# Patient Record
Sex: Male | Born: 1970 | Race: Asian | Hispanic: No | Marital: Married | Smoking: Never smoker
Health system: Southern US, Community
[De-identification: ages and names within clinical notes are randomized; demographics above are authoritative.]

## PROBLEM LIST (undated history)

## (undated) DIAGNOSIS — E119 Type 2 diabetes mellitus without complications: Secondary | ICD-10-CM

## (undated) DIAGNOSIS — R519 Headache, unspecified: Secondary | ICD-10-CM

## (undated) DIAGNOSIS — R079 Chest pain, unspecified: Secondary | ICD-10-CM

## (undated) DIAGNOSIS — I10 Essential (primary) hypertension: Secondary | ICD-10-CM

## (undated) DIAGNOSIS — R51 Headache: Secondary | ICD-10-CM

## (undated) DIAGNOSIS — K5792 Diverticulitis of intestine, part unspecified, without perforation or abscess without bleeding: Secondary | ICD-10-CM

## (undated) HISTORY — PX: APPENDECTOMY: SHX54

---

## 2006-11-22 ENCOUNTER — Emergency Department (HOSPITAL_COMMUNITY): Admission: EM | Admit: 2006-11-22 | Discharge: 2006-11-22 | Payer: Self-pay | Admitting: Emergency Medicine

## 2011-12-28 ENCOUNTER — Emergency Department (INDEPENDENT_AMBULATORY_CARE_PROVIDER_SITE_OTHER)
Admission: EM | Admit: 2011-12-28 | Discharge: 2011-12-28 | Disposition: A | Discharge status: Home / Self Care | Payer: Self-pay | Source: Home / Self Care | Attending: Emergency Medicine | Admitting: Emergency Medicine

## 2011-12-28 ENCOUNTER — Emergency Department (INDEPENDENT_AMBULATORY_CARE_PROVIDER_SITE_OTHER): Payer: Self-pay

## 2011-12-28 ENCOUNTER — Encounter (HOSPITAL_COMMUNITY): Payer: Self-pay | Admitting: *Deleted

## 2011-12-28 DIAGNOSIS — J40 Bronchitis, not specified as acute or chronic: Secondary | ICD-10-CM

## 2011-12-28 HISTORY — DX: Essential (primary) hypertension: I10

## 2011-12-28 MED ORDER — METHYLPREDNISOLONE 4 MG PO KIT: PACK | ORAL | Status: DC

## 2011-12-28 MED ORDER — IPRATROPIUM BROMIDE 0.02 % IN SOLN
0.5000 mg | Freq: Once | RESPIRATORY_TRACT | Status: AC
  Administered 2011-12-28: 0.5 mg via RESPIRATORY_TRACT

## 2011-12-28 MED ORDER — ALBUTEROL SULFATE HFA 108 (90 BASE) MCG/ACT IN AERS: 1.0000 | INHALATION_SPRAY | Freq: Four times a day (QID) | RESPIRATORY_TRACT | Status: DC | PRN

## 2011-12-28 MED ORDER — ALBUTEROL SULFATE (5 MG/ML) 0.5% IN NEBU
2.5000 mg | INHALATION_SOLUTION | Freq: Once | RESPIRATORY_TRACT | Status: AC
  Administered 2011-12-28: 2.5 mg via RESPIRATORY_TRACT

## 2011-12-28 MED ORDER — ALBUTEROL SULFATE (5 MG/ML) 0.5% IN NEBU
INHALATION_SOLUTION | RESPIRATORY_TRACT | Status: AC
  Filled 2011-12-28: qty 0.5

## 2011-12-28 NOTE — ED Notes (Signed)
States feels much better to breathe.

## 2011-12-28 NOTE — ED Notes (Signed)
Started with dry cough last Friday with high fever.  Continues to feel feverish at times, cough now productive and he feels SOB when coughing.  Painful chest with coughing.  Feels difficulty talking due to airway irritation that starts him coughing.  Ronchi and I&E wheezing noted left lung; expiratory wheezing right side.  Has been taking Tyl and Nyquil.  Denies n/v/d.

## 2011-12-28 NOTE — ED Provider Notes (Signed)
History     CSN: [id number]  Arrival date & time 12/28/11  1139   First MD Initiated Contact with Patient 12/28/11 1348      Chief Complaint  Patient presents with  . Cough  . Shortness of Breath    (Consider location/radiation/quality/duration/timing/severity/associated sxs/prior treatment) HPI Comments: 41 year old male is been having 4-5 days of cough and wheezing. He does not have a history of asthma. Initially he had low-grade fever but has not had a fever in 2-3 days. Chief complaint is trouble breathing and cough. No chest pain, vomiting nausea GI or GU symptoms.   Past Medical History  Diagnosis Date  . Hypertension     History reviewed. No pertinent past surgical history.  No family history on file.  History  Substance Use Topics  . Smoking status: Never Smoker   . Smokeless tobacco: Not on file  . Alcohol Use: No      Review of Systems  Constitutional: Positive for activity change.  HENT: Negative.   Respiratory: Positive for cough, shortness of breath and wheezing.   Cardiovascular: Negative for chest pain, palpitations and leg swelling.  Gastrointestinal: Negative.   Musculoskeletal: Negative.   Neurological: Negative.     Allergies  Review of patient's allergies indicates no known allergies.  Home Medications   Current Outpatient Rx  Name  Route  Sig  Dispense  Refill  . ENALAPRIL MALEATE PO   Oral   Take by mouth.         . BYSTOLIC PO   Oral   Take by mouth.         . ALBUTEROL SULFATE HFA 108 (90 BASE) MCG/ACT IN AERS   Inhalation   Inhale 1-2 puffs into the lungs every 6 (six) hours as needed for wheezing.   1 Inhaler   0   . METHYLPREDNISOLONE 4 MG PO KIT      follow package directions   21 tablet   0     BP 144/99  Pulse 87  Temp 99 F (37.2 C) (Oral)  Resp 24  SpO2 98%  Physical Exam  Nursing note and vitals reviewed. Constitutional: He is oriented to person, place, and time. He appears well-developed and  well-nourished. No distress.  HENT:  Mouth/Throat: No oropharyngeal exudate.       Oropharynx with minor injection but no signs of infection or exudates. Bilateral TMs are normal  Neck: Normal range of motion. Neck supple.  Pulmonary/Chest:       On initial exam he had increased effort breathing with inspiratory and expiratory wheezes and prolonged expiratory phase.  Abdominal: Soft.  Musculoskeletal: He exhibits no edema.  Neurological: He is alert and oriented to person, place, and time.  Skin: Skin is warm and dry.  Psychiatric: He has a normal mood and affect.    ED Course  Procedures (including critical care time)  Labs Reviewed - No data to display Dg Chest 2 View  12/28/2011  *RADIOLOGY REPORT*  Clinical Data: Cough and fever.  CHEST - 2 VIEW  Comparison: No priors.  Findings: Lung volumes are low.  No consolidative airspace disease. No pleural effusions.  No pneumothorax.  No pulmonary nodule or mass noted.  Pulmonary vasculature and the cardiomediastinal silhouette are within normal limits.  IMPRESSION: 1.  Low lung volumes without radiographic evidence of acute cardiopulmonary disease.   Original Report Authenticated By: Trudie Reed, M.D.      1. Bronchitis       MDM  Patient was  administered a duo neb shortly after arrival. At the duo neb he states he feels 100% better. His lungs are essentially clear moving air well inspiratory phase equals expiratory phase there are no rales or rhonchi. He is afebrile and otherwise asymptomatic. Prescription for Medrol Dosepak and Proventil HFA 2 puffs every 4 hours when necessary cough or wheeze. He is stable and feeling better. For any worsening new symptoms or problems shortness of breath, problems breathing fever excessive cough he is to go to the emergency department and also may return. X-ray is noted as above. Vital signs are noted. Suspect viral bronchitis with reactive airway disease.        Hayden Rasmussen,  NP 12/28/11 [phone]

## 2012-01-01 NOTE — ED Provider Notes (Signed)
Medical screening examination/treatment/procedure(s) were performed by resident physician or non-physician practitioner and as supervising physician I was immediately available for consultation/collaboration.   Barkley Bruns MD.    Linna Hoff, MD 01/01/12 1130

## 2012-08-09 ENCOUNTER — Observation Stay (HOSPITAL_COMMUNITY)
Admission: EM | Admit: 2012-08-09 | Discharge: 2012-08-10 | Disposition: A | Discharge status: Home / Self Care | Payer: No Typology Code available for payment source | Attending: Cardiology | Admitting: Cardiology

## 2012-08-09 ENCOUNTER — Emergency Department (HOSPITAL_COMMUNITY): Payer: No Typology Code available for payment source

## 2012-08-09 ENCOUNTER — Encounter (HOSPITAL_COMMUNITY): Payer: Self-pay | Admitting: Emergency Medicine

## 2012-08-09 DIAGNOSIS — I10 Essential (primary) hypertension: Secondary | ICD-10-CM | POA: Insufficient documentation

## 2012-08-09 DIAGNOSIS — H538 Other visual disturbances: Secondary | ICD-10-CM | POA: Insufficient documentation

## 2012-08-09 DIAGNOSIS — R7309 Other abnormal glucose: Secondary | ICD-10-CM | POA: Insufficient documentation

## 2012-08-09 DIAGNOSIS — E7800 Pure hypercholesterolemia, unspecified: Secondary | ICD-10-CM | POA: Insufficient documentation

## 2012-08-09 DIAGNOSIS — R61 Generalized hyperhidrosis: Secondary | ICD-10-CM | POA: Insufficient documentation

## 2012-08-09 DIAGNOSIS — R109 Unspecified abdominal pain: Principal | ICD-10-CM | POA: Insufficient documentation

## 2012-08-09 LAB — BASIC METABOLIC PANEL
BUN: 17 mg/dL (ref 6–23)
CO2: 24 mEq/L (ref 19–32)
Calcium: 9.9 mg/dL (ref 8.4–10.5)
Chloride: 101 mEq/L (ref 96–112)
GFR calc non Af Amer: 90 mL/min — ABNORMAL LOW (ref >90)
Glucose, Bld: 139 mg/dL — ABNORMAL HIGH (ref 70–99)

## 2012-08-09 LAB — CBC
MCHC: 34.7 g/dL (ref 30.0–36.0)
MCV: 84.5 fL (ref 78.0–100.0)
Platelets: 294 10*3/uL (ref 150–400)
RBC: 4.78 MIL/uL (ref 4.22–5.81)
RDW: 13.2 % (ref 11.5–15.5)

## 2012-08-09 LAB — CBC WITH DIFFERENTIAL/PLATELET
Eosinophils Relative: 2 % (ref 0–5)
Hemoglobin: 14.4 g/dL (ref 13.0–17.0)
Lymphocytes Relative: 35 % (ref 12–46)
Lymphs Abs: 2.7 10*3/uL (ref 0.7–4.0)
MCV: 84.1 fL (ref 78.0–100.0)
Platelets: 295 10*3/uL (ref 150–400)
RBC: 4.83 MIL/uL (ref 4.22–5.81)
WBC: 7.7 10*3/uL (ref 4.0–10.5)

## 2012-08-09 LAB — POCT I-STAT TROPONIN I: Troponin i, poc: 0 ng/mL (ref 0.00–0.08)

## 2012-08-09 LAB — LIPASE, BLOOD: Lipase: 49 U/L (ref 11–59)

## 2012-08-09 LAB — HEPATIC FUNCTION PANEL
ALT: 32 U/L (ref 0–53)
Albumin: 3.9 g/dL (ref 3.5–5.2)
Alkaline Phosphatase: 78 U/L (ref 39–117)
Indirect Bilirubin: 0.6 mg/dL (ref 0.3–0.9)

## 2012-08-09 LAB — MAGNESIUM: Magnesium: 2 mg/dL (ref 1.5–2.5)

## 2012-08-09 LAB — CREATININE, SERUM
GFR calc Af Amer: >90 mL/min (ref >90)
GFR calc non Af Amer: >90 mL/min (ref >90)

## 2012-08-09 LAB — PROTIME-INR: Prothrombin Time: 12.4 seconds (ref 11.6–15.2)

## 2012-08-09 IMAGING — CT CT HEAD W/O CM
2 series · 16 of 30 positions shown, 20 images · non-contrast
Comparison: None.

CLINICAL DATA: Severe headache

CT HEAD WITHOUT CONTRAST
TECHNIQUE: Contiguous axial images were obtained from the base of
the skull through the vertex without contrast

[Series 2: head w/o · axial · non-contrast · 0.49mm/px · z∈[+87,+207]mm · 13 of 30 slices shown, 17 images]
[im 3/30  brain]
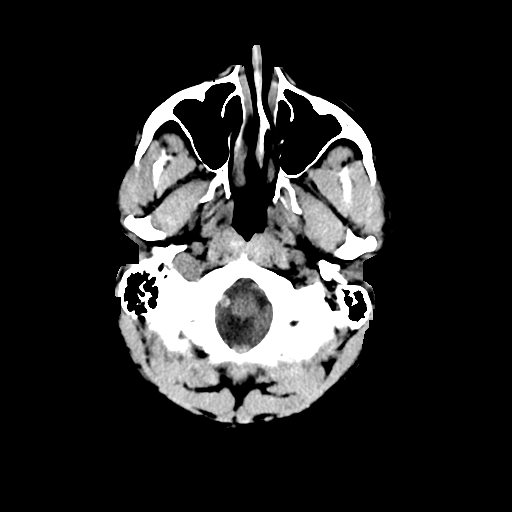
[im 3/30  bone]
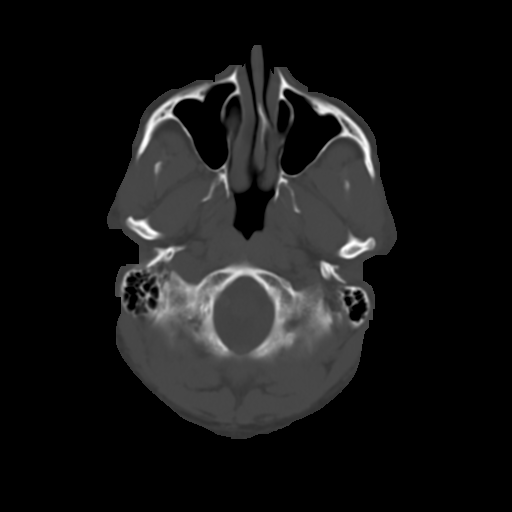
[im 5/30  brain]
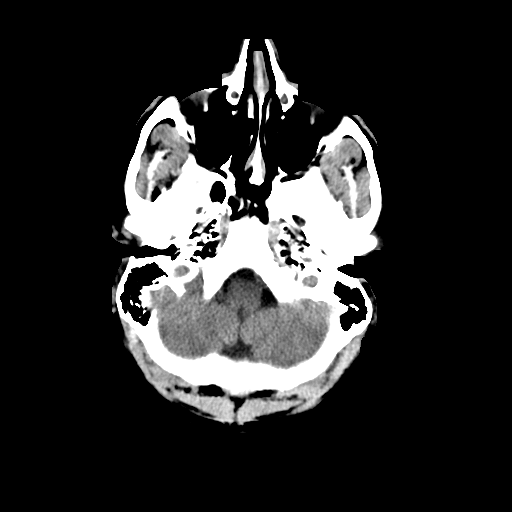
[im 7/30  brain]
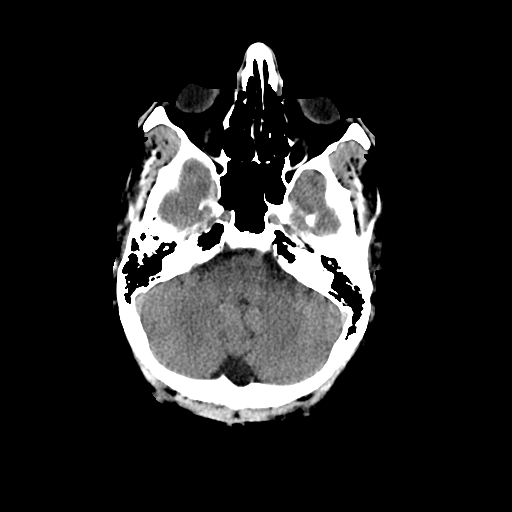
[im 9/30  brain]
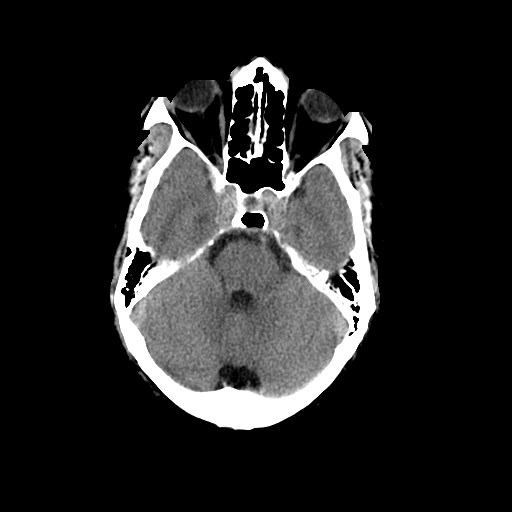
[im 11/30  brain]
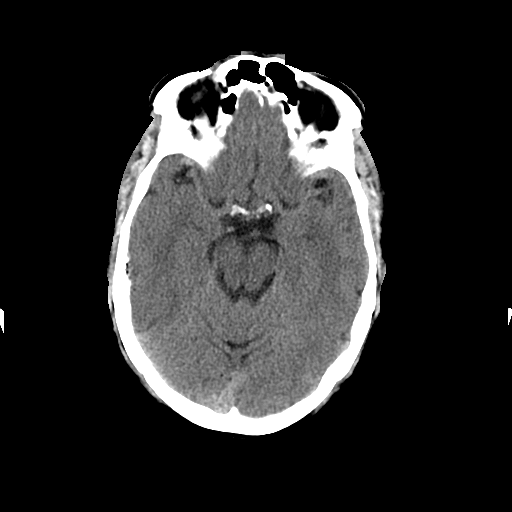
[im 11/30  bone]
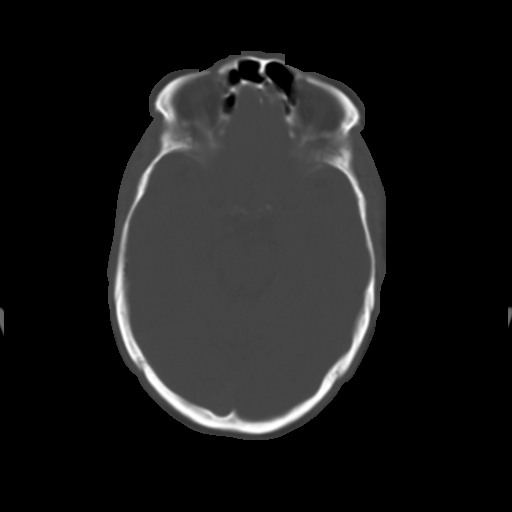
[im 13/30  brain]
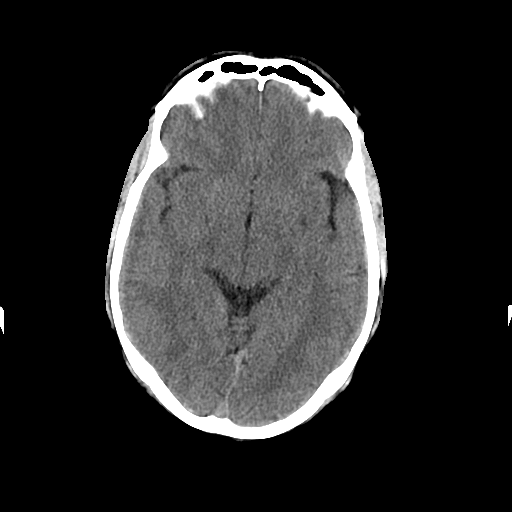
[im 15/30  brain]
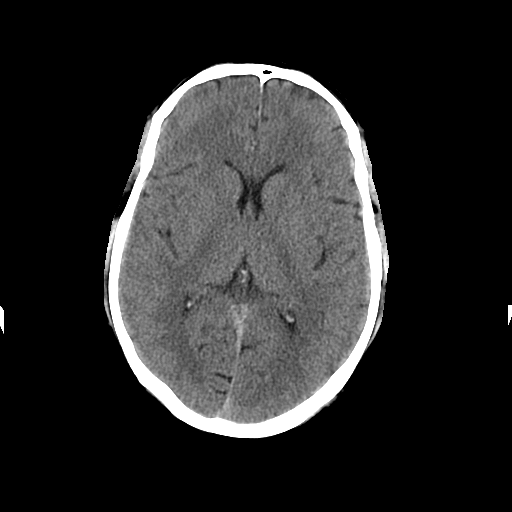
[im 17/30  brain]
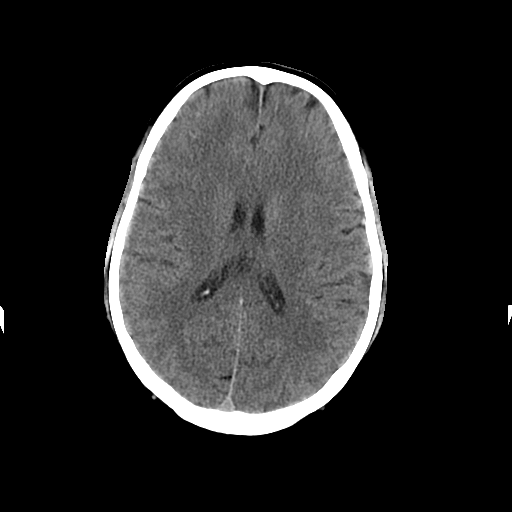
[im 19/30  brain]
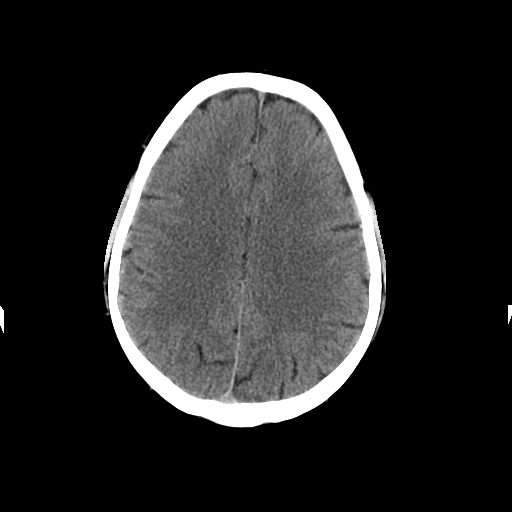
[im 19/30  bone]
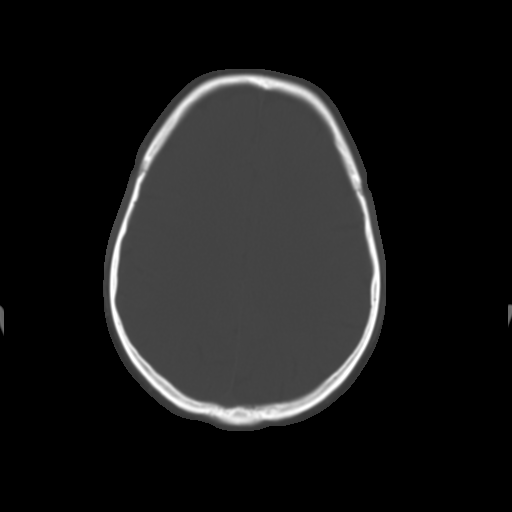
[im 21/30  brain]
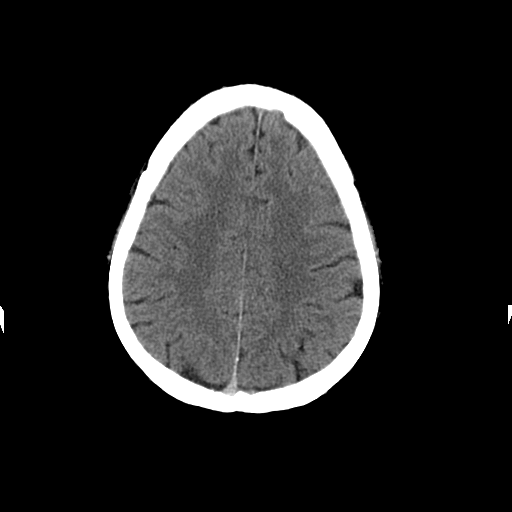
[im 23/30  brain]
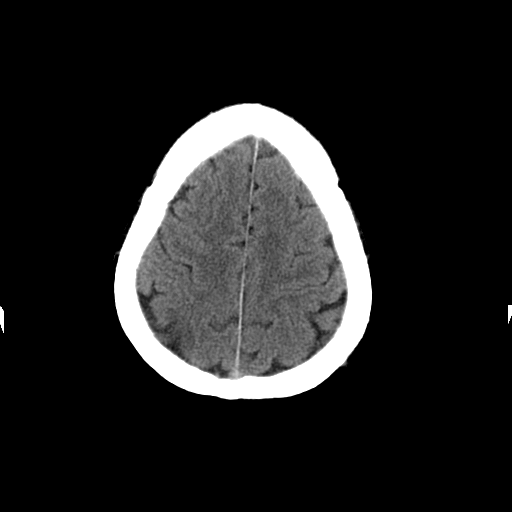
[im 25/30  brain]
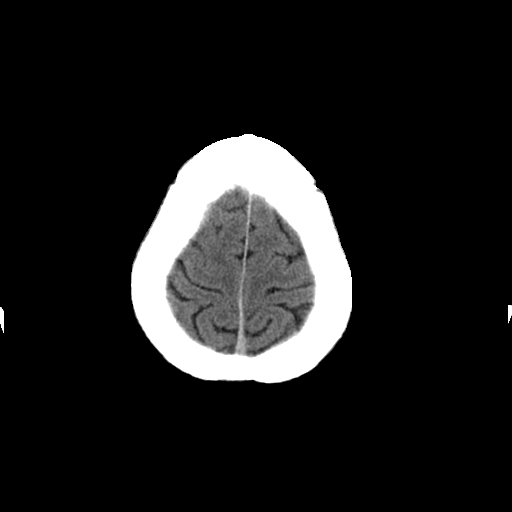
[im 27/30  brain]
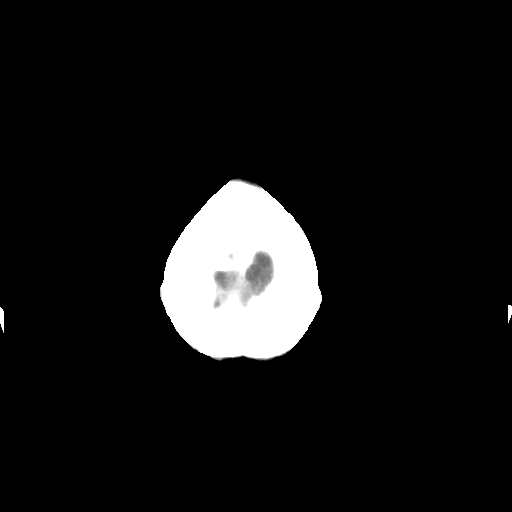
[im 27/30  bone]
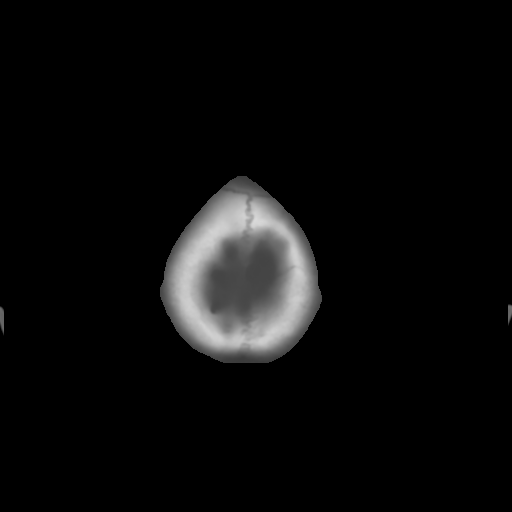

[Series 3: head w/o bone · axial · non-contrast · 0.49mm/px · z∈[+87,+127]mm · 3 of 30 slices shown]
[im 3/30  bone]
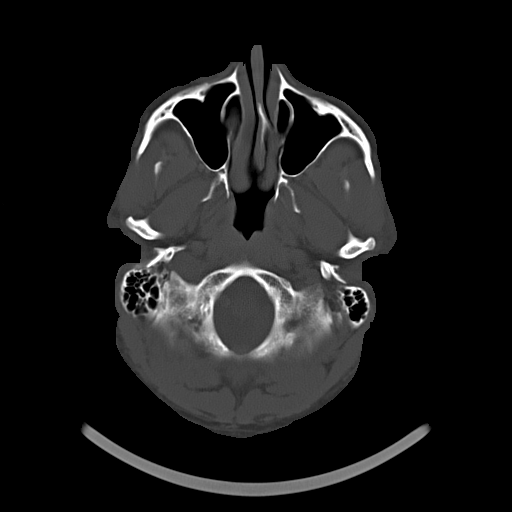
[im 7/30  bone]
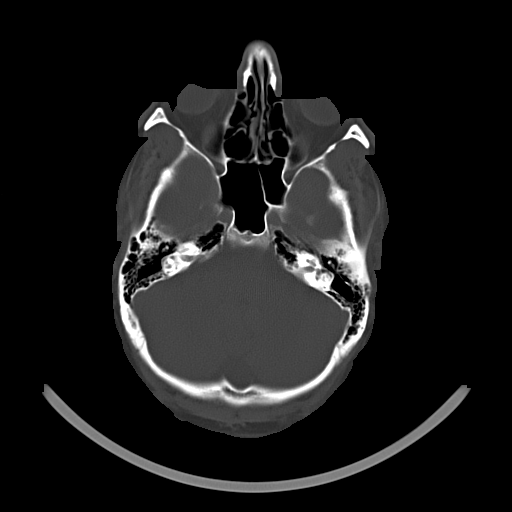
[im 11/30  bone]
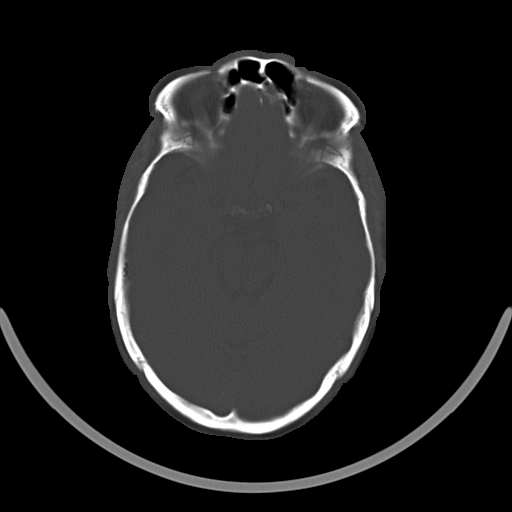

[16 of 30 positions shown; findings below may reference images not displayed]

FINDINGS: The brain has a normal appearance without evidence for
hemorrhage, acute infarction, hydrocephalus, or mass lesion.  There
is no extra axial fluid collection.  The skull and paranasal
sinuses are normal.
IMPRESSION: Normal CT of the head without contrast.

## 2012-08-09 MED ORDER — SODIUM CHLORIDE 0.9 % IV SOLN
INTRAVENOUS | Status: DC
  Administered 2012-08-09 – 2012-08-10 (×2): via INTRAVENOUS

## 2012-08-09 MED ORDER — HEPARIN SODIUM (PORCINE) 5000 UNIT/ML IJ SOLN
5000.0000 [IU] | Freq: Three times a day (TID) | INTRAMUSCULAR | Status: DC
  Administered 2012-08-09 – 2012-08-10 (×2): 5000 [IU] via SUBCUTANEOUS
  Filled 2012-08-09 (×5): qty 1

## 2012-08-09 MED ORDER — ACETAMINOPHEN 325 MG PO TABS: 650.0000 mg | ORAL_TABLET | ORAL | Status: DC | PRN

## 2012-08-09 MED ORDER — ASPIRIN EC 81 MG PO TBEC
81.0000 mg | DELAYED_RELEASE_TABLET | Freq: Every day | ORAL | Status: DC
  Filled 2012-08-09: qty 1

## 2012-08-09 MED ORDER — NITROGLYCERIN 0.4 MG SL SUBL: 0.4000 mg | SUBLINGUAL_TABLET | SUBLINGUAL | Status: DC | PRN

## 2012-08-09 MED ORDER — ALPRAZOLAM 0.25 MG PO TABS: 0.2500 mg | ORAL_TABLET | Freq: Two times a day (BID) | ORAL | Status: DC | PRN

## 2012-08-09 MED ORDER — ASPIRIN 81 MG PO CHEW: 324.0000 mg | CHEWABLE_TABLET | ORAL | Status: DC

## 2012-08-09 MED ORDER — ASPIRIN 300 MG RE SUPP
300.0000 mg | RECTAL | Status: DC
  Filled 2012-08-09: qty 1

## 2012-08-09 MED ORDER — ONDANSETRON HCL 4 MG/2ML IJ SOLN: 4.0000 mg | Freq: Three times a day (TID) | INTRAMUSCULAR | Status: AC | PRN

## 2012-08-09 MED ORDER — ONDANSETRON HCL 4 MG/2ML IJ SOLN: 4.0000 mg | Freq: Four times a day (QID) | INTRAMUSCULAR | Status: DC | PRN

## 2012-08-09 MED ORDER — METOPROLOL TARTRATE 12.5 MG HALF TABLET
12.5000 mg | ORAL_TABLET | Freq: Two times a day (BID) | ORAL | Status: DC
  Administered 2012-08-09: 12.5 mg via ORAL
  Filled 2012-08-09 (×3): qty 1

## 2012-08-09 MED ORDER — PANTOPRAZOLE SODIUM 40 MG PO TBEC
40.0000 mg | DELAYED_RELEASE_TABLET | Freq: Every day | ORAL | Status: DC
  Administered 2012-08-10: 40 mg via ORAL
  Filled 2012-08-09: qty 1

## 2012-08-09 MED ORDER — ATORVASTATIN CALCIUM 80 MG PO TABS
80.0000 mg | ORAL_TABLET | Freq: Every day | ORAL | Status: DC
  Filled 2012-08-09: qty 1

## 2012-08-09 NOTE — H&P (Signed)
[name] [name] is an 42 y.o. male.   Chief Complaint: Abdominal pain associated with diaphoresis/status post blurring of vision HPI: Patient is 42 year old male with past medical history significant for hypertension, hypercholesteremia, came to the ER complaining of vague abdominal pain associated with diaphoresis after loading his truck followed by sudden l blurring of vision while driving. Stopped his vehicle took a nap for 10 minutes with normalization of his vision. Patient denies any slurred speech denies any weakness in the arm sore legs. Denies any seizure activity. Denies any chest pain nausea or vomiting shortness of breath. Denies any palpitations lightheadedness or syncopal episode. Patient is history of exertional abdominal pain relieved with rest. Patient presently denies any complaints.  Past Medical History  Diagnosis Date  . Hypertension     History reviewed. No pertinent past surgical history.  History reviewed. No pertinent family history. Social History:  reports that he has never smoked. He does not have any smokeless tobacco history on file. He reports that he does not drink alcohol or use illicit drugs.  Allergies: No Known Allergies   (Not in a hospital admission)  Results for orders placed during the hospital encounter of 08/09/12 (from the past 48 hour(s))  CBC     Status: None   Collection Time    08/09/12 12:58 PM      Result Value Range   WBC 8.4  4.0 - 10.5 K/uL   RBC 4.78  4.22 - 5.81 MIL/uL   Hemoglobin 14.0  13.0 - 17.0 g/dL   HCT 09.8  11.9 - 14.7 %   MCV 84.5  78.0 - 100.0 fL   MCH 29.3  26.0 - 34.0 pg   MCHC 34.7  30.0 - 36.0 g/dL   RDW 82.9  56.2 - 13.0 %   Platelets 294  150 - 400 K/uL  BASIC METABOLIC PANEL     Status: Abnormal   Collection Time    08/09/12 12:58 PM      Result Value Range   Sodium 136  135 - 145 mEq/L   Potassium 4.6  3.5 - 5.1 mEq/L   Chloride 101  96 - 112 mEq/L   CO2 24  19 - 32 mEq/L   Glucose, Bld 139 (*) 70 - 99  mg/dL   BUN 17  6 - 23 mg/dL   Creatinine, Ser 8.65  0.50 - 1.35 mg/dL   Calcium 9.9  8.4 - 78.4 mg/dL   GFR calc non Af Amer 90 (*) >90 mL/min   GFR calc Af Amer >90  >90 mL/min   Comment:            The eGFR has been calculated     using the CKD EPI equation.     This calculation has not been     validated in all clinical     situations.     eGFR's persistently     <90 mL/min signify     possible Chronic Kidney Disease.  HEPATIC FUNCTION PANEL     Status: None   Collection Time    08/09/12 12:58 PM      Result Value Range   Total Protein 7.4  6.0 - 8.3 g/dL   Albumin 3.9  3.5 - 5.2 g/dL   AST 24  0 - 37 U/L   ALT 32  0 - 53 U/L   Alkaline Phosphatase 78  39 - 117 U/L   Total Bilirubin 0.7  0.3 - 1.2 mg/dL   Bilirubin, Direct 0.1  0.0 - 0.3 mg/dL   Indirect Bilirubin 0.6  0.3 - 0.9 mg/dL  LIPASE, BLOOD     Status: None   Collection Time    08/09/12 12:58 PM      Result Value Range   Lipase 49  11 - 59 U/L  POCT I-STAT TROPONIN I     Status: None   Collection Time    08/09/12  1:03 PM      Result Value Range   Troponin i, poc 0.00  0.00 - 0.08 ng/mL   Comment 3            Comment: Due to the release kinetics of cTnI,     a negative result within the first hours     of the onset of symptoms does not rule out     myocardial infarction with certainty.     If myocardial infarction is still suspected,     repeat the test at appropriate intervals.   Dg Chest 2 View  08/09/2012   *RADIOLOGY REPORT*  Clinical Data: Loss of consciousness.  CHEST - 2 VIEW  Comparison: 12/28/2011  Findings: Lung volume is normal.  Lungs are clear without infiltrate or effusion.  No heart failure or mass lesion.  IMPRESSION: Negative   Original Report Authenticated By: Janeece Riggers, M.D.   Ct Head Wo Contrast  08/09/2012   *RADIOLOGY REPORT*  Clinical Data:  Severe headache  CT HEAD WITHOUT CONTRAST  Technique:  Contiguous axial images were obtained from the base of the skull through the vertex  without contrast  Comparison:  None.  Findings:  The brain has a normal appearance without evidence for hemorrhage, acute infarction, hydrocephalus, or mass lesion.  There is no extra axial fluid collection.  The skull and paranasal sinuses are normal.  IMPRESSION: Normal CT of the head without contrast.   Original Report Authenticated By: Janeece Riggers, M.D.    Review of Systems  Constitutional: Positive for diaphoresis. Negative for fever, chills and weight loss.  HENT: Negative for hearing loss and tinnitus.   Eyes: Negative for blurred vision and double vision.  Cardiovascular: Negative for chest pain, palpitations, orthopnea, claudication and leg swelling.  Gastrointestinal: Positive for abdominal pain. Negative for nausea and vomiting.  Genitourinary: Negative for dysuria.  Neurological: Negative for dizziness, seizures, weakness and headaches.    Blood pressure 106/80, pulse 61, temperature 98.1 F (36.7 C), temperature source Oral, resp. rate 25, height 5\' 8"  (1.727 m), weight 91.627 kg (202 lb), SpO2 97.00%. Physical Exam  Constitutional: He is oriented to person, place, and time.  HENT:  Head: Normocephalic.  Eyes: Conjunctivae are normal. Pupils are equal, round, and reactive to light. Left eye exhibits no discharge. No scleral icterus.  Neck: Neck supple. No JVD present. No tracheal deviation present. No thyromegaly present.  Cardiovascular: Normal rate, regular rhythm and intact distal pulses.  Exam reveals no friction rub.   Murmur heard. Respiratory: Effort normal and breath sounds normal. No respiratory distress. He has no wheezes. He has no rales.  GI: Soft. Bowel sounds are normal. He exhibits no distension. There is no tenderness. There is no rebound and no guarding.  Musculoskeletal: He exhibits no edema and no tenderness.  Neurological: He is alert and oriented to person, place, and time. No cranial nerve deficit. Coordination normal.     Assessment/Plan Exertional  abdominal pain with diaphoresis rule out coronary insufficiency Status post questionable TIA Hypertension Hypercholesteremia Plan As per orders  Barb Shear N 08/09/2012, 4:28 PM

## 2012-08-09 NOTE — ED Provider Notes (Signed)
CSN: [id number]     Arrival date & time 08/09/12  1242 History     First MD Initiated Contact with Patient 08/09/12 1345     Chief Complaint  Patient presents with  . Loss of Consciousness   (Consider location/radiation/quality/duration/timing/severity/associated sxs/prior Treatment) HPI  [name] [name] is a 42 y.o. male with past medical history significant for hypertension and high cholesterol coming in today by private vehicle complaining of episode of loss of vision and questionable loss of consciousness. Patient was driving in his car and states that he had a pain across the bilateral upper abdomen one similar to a muscle pain associated with diaphoresis he states that he completely lost his vision and it went black states that it was like a curtain going down over both eyes. He states that then he laid down and took a nap for 10 minutes he woke up and his vision returned, he called his primary care doctor who told him to take an aspirin present to the nearest emergency room, the incident happened at 10:30 AM. He then proceeded to run parents and when he started having the sensation of diaphoresis again he drove himself to the emergency room. Patient denies any chest pain, shortness of breath, dysarthria, ataxia, numbness, weakness, nausea vomiting, change in bowel or bladder habits. Patient did have a recent long trip he drove from [place] to [place] to drive was approximately 12 hours he left on August 2. He denies prior history of DVT or PE, leg swelling or calf pain. Denies any active pain, or loss of bowel or bladder control during the episode  PCP Harwani  Past Medical History  Diagnosis Date  . Hypertension    History reviewed. No pertinent past surgical history. History reviewed. No pertinent family history. History  Substance Use Topics  . Smoking status: Never Smoker   . Smokeless tobacco: Not on file  . Alcohol Use: No    Review of Systems  10 systems reviewed and found  to be negative, except as noted in the HPI   Allergies  Review of patient's allergies indicates no known allergies.  Home Medications   Current Outpatient Rx  Name  Route  Sig  Dispense  Refill  . albuterol (PROVENTIL HFA;VENTOLIN HFA) 108 (90 BASE) MCG/ACT inhaler   Inhalation   Inhale 1-2 puffs into the lungs every 6 (six) hours as needed for wheezing.         . enalapril (VASOTEC) 20 MG tablet   Oral   Take 20 mg by mouth daily.         Marland Kitchen gemfibrozil (LOPID) 600 MG tablet   Oral   Take 600 mg by mouth 2 (two) times daily.         . nebivolol (BYSTOLIC) 5 MG tablet   Oral   Take 5 mg by mouth daily.          BP 113/71  Pulse 79  Temp(Src) 98.1 F (36.7 C) (Oral)  Resp 16  Ht 5\' 8"  (1.727 m)  Wt 202 lb (91.627 kg)  BMI 30.72 kg/m2  SpO2 97% Physical Exam  Nursing note and vitals reviewed. Constitutional: He is oriented to person, place, and time. He appears well-developed and well-nourished. No distress.  HENT:  Head: Normocephalic and atraumatic.  Eyes: Conjunctivae and EOM are normal. Pupils are equal, round, and reactive to light.  Neck: Normal range of motion. Neck supple. No JVD present.  Cardiovascular: Normal rate, regular rhythm and intact distal pulses.  Pulmonary/Chest: Effort normal and breath sounds normal. No stridor. No respiratory distress. He has no wheezes. He has no rales. He exhibits no tenderness.  Abdominal: Soft. Bowel sounds are normal. He exhibits no distension and no mass. There is no tenderness. There is no rebound and no guarding.  Musculoskeletal: Normal range of motion. He exhibits no edema.  No calf asymmetry, superficial collaterals, palpable cords, edema, Homans sign negative bilaterally.    Neurological: He is alert and oriented to person, place, and time. No cranial nerve deficit.  Cranial nerves III through XII intact, strength 5 out of 5x4 extremities, negative pronator drift, finger to nose and heel-to-shin coordinated,  sensation intact to pinprick and light touch, Romberg is negative.   Gait is not evaluated   Psychiatric: He has a normal mood and affect.    ED Course   Procedures (including critical care time)  Labs Reviewed  BASIC METABOLIC PANEL - Abnormal; Notable for the following:    Glucose, Bld 139 (*)    GFR calc non Af Amer 90 (*)    All other components within normal limits  CBC  HEPATIC FUNCTION PANEL  LIPASE, BLOOD  POCT I-STAT TROPONIN I   Dg Chest 2 View  08/09/2012   *RADIOLOGY REPORT*  Clinical Data: Loss of consciousness.  CHEST - 2 VIEW  Comparison: 12/28/2011  Findings: Lung volume is normal.  Lungs are clear without infiltrate or effusion.  No heart failure or mass lesion.  IMPRESSION: Negative   Original Report Authenticated By: Janeece Riggers, M.D.   Ct Head Wo Contrast  08/09/2012   *RADIOLOGY REPORT*  Clinical Data:  Severe headache  CT HEAD WITHOUT CONTRAST  Technique:  Contiguous axial images were obtained from the base of the skull through the vertex without contrast  Comparison:  None.  Findings:  The brain has a normal appearance without evidence for hemorrhage, acute infarction, hydrocephalus, or mass lesion.  There is no extra axial fluid collection.  The skull and paranasal sinuses are normal.  IMPRESSION: Normal CT of the head without contrast.   Original Report Authenticated By: Janeece Riggers, M.D.    Date: 08/09/2012  Rate: 79  Rhythm: normal sinus rhythm  QRS Axis: normal  Intervals: normal  ST/T Wave abnormalities: normal  Conduction Disutrbances:none  Narrative Interpretation:   Old EKG Reviewed: unchanged  1. Diaphoresis   2. Abdominal pain     MDM   Filed Vitals:   08/09/12 1245 08/09/12 1541  BP: 113/71 109/65  Pulse: 79 68  Temp: 98.1 F (36.7 C)   TempSrc: Oral   Resp: 16 18  Height: 5\' 8"  (1.727 m)   Weight: 202 lb (91.627 kg)   SpO2: 97% 98%     [name] [name] is a 42 y.o. male presenting with diaphoresis, questionable loss of  consciousness, patient states that he lost all vision with a curtain descending in both eyes earlier in the day, he states this is now perfectly resolved. Patient has normal neurologic exam, EKG is nonischemic, troponin and blood work are unremarkable. Chest x-ray and head CT are also unremarkable.  Patient will be admitted under the care of Dr. Sharyn Lull, plan is to stress him in the a.m.  Note: Portions of this report may have been transcribed using voice recognition software. Every effort was made to ensure accuracy; however, inadvertent computerized transcription errors may be present     Wynetta Emery, PA-C 08/09/12 1609

## 2012-08-09 NOTE — ED Notes (Addendum)
Patient presents to ED with c/o syncopal episode this am. Patient reports he loaded his car then sat down in his car became diaphoretic and passed out. Patient reports pain in his abdomen before the episode this am. But no pain now.

## 2012-08-09 NOTE — ED Notes (Addendum)
Pt states he went to [employer] to pick up some supplies. Pt was loading the supplies into his truck when he began to have some abd pain. Pt states he thought he needed to go to the restroom so drove to a store. Pt states his vision began to get blurry while driving, he eventually pulled over and laid down for about after he awakened he called Dr. Sharyn Lull and was told to take an aspirin and go to the ED. Pt states he went to run a few more errands and began to break into a sweat so he decided he should come to the ED now. Pt states sx started around 10:45pm. Pt recently drove to [place] on the 2nd or 3rd of August.

## 2012-08-09 NOTE — ED Provider Notes (Signed)
Medical screening examination/treatment/procedure(s) were performed by non-physician practitioner and as supervising physician I was immediately available for consultation/collaboration.  Layla Maw Ramonia Mcclaran, DO 08/09/12 [phone]

## 2012-08-10 ENCOUNTER — Observation Stay (HOSPITAL_COMMUNITY): Payer: No Typology Code available for payment source

## 2012-08-10 ENCOUNTER — Other Ambulatory Visit: Payer: Self-pay

## 2012-08-10 LAB — BASIC METABOLIC PANEL
CO2: 28 mEq/L (ref 19–32)
Glucose, Bld: 109 mg/dL — ABNORMAL HIGH (ref 70–99)
Potassium: 4.5 mEq/L (ref 3.5–5.1)
Sodium: 138 mEq/L (ref 135–145)

## 2012-08-10 LAB — LIPID PANEL
Cholesterol: 208 mg/dL — ABNORMAL HIGH (ref 0–200)
HDL: 33 mg/dL — ABNORMAL LOW (ref >39)
Triglycerides: 353 mg/dL — ABNORMAL HIGH (ref <150)

## 2012-08-10 LAB — CBC
Hemoglobin: 14.4 g/dL (ref 13.0–17.0)
MCH: 30 pg (ref 26.0–34.0)
RBC: 4.8 MIL/uL (ref 4.22–5.81)

## 2012-08-10 LAB — TSH: TSH: 0.801 u[IU]/mL (ref 0.350–4.500)

## 2012-08-10 MED ORDER — GADOBENATE DIMEGLUMINE 529 MG/ML IV SOLN
20.0000 mL | Freq: Once | INTRAVENOUS | Status: AC | PRN
  Administered 2012-08-10: 20 mL via INTRAVENOUS

## 2012-08-10 MED ORDER — ASPIRIN 81 MG PO TBEC: 81.0000 mg | DELAYED_RELEASE_TABLET | Freq: Every day | ORAL | Status: AC

## 2012-08-10 MED ORDER — TECHNETIUM TC 99M SESTAMIBI GENERIC - CARDIOLITE
30.0000 | Freq: Once | INTRAVENOUS | Status: AC | PRN
  Administered 2012-08-10: 30 via INTRAVENOUS

## 2012-08-10 MED ORDER — TECHNETIUM TC 99M SESTAMIBI GENERIC - CARDIOLITE
10.0000 | Freq: Once | INTRAVENOUS | Status: AC | PRN
  Administered 2012-08-10: 10 via INTRAVENOUS

## 2012-08-10 NOTE — Progress Notes (Signed)
UR Completed.  Kasmira Cacioppo Jane [phone] 08/10/2012  

## 2012-08-10 NOTE — Discharge Summary (Signed)
  D/C dictated on 08/10/12 #[id number]

## 2012-08-10 NOTE — Progress Notes (Signed)
Subjective:  Patient denies any chest pain or shortness of breath. No further abdominal pain or diaphoresis. No further blurring of vision.  Objective:  Vital Signs in the last 24 hours: Temp:  [97.7 F (36.5 C)-98.2 F (36.8 C)] 98.2 F (36.8 C) (08/10 0504) Pulse Rate:  [61-87] 63 (08/10 0504) Resp:  [11-25] 17 (08/10 0504) BP: (101-130)/(63-85) 103/63 mmHg (08/10 0504) SpO2:  [95 %-100 %] 96 % (08/10 0504) Weight:  [91.627 kg (202 lb)] 91.627 kg (202 lb) (08/09 2015)  Intake/Output from previous day: 08/09 0701 - 08/10 0700 In: 682.5 [I.V.:682.5] Out: -  Intake/Output from this shift:    Physical Exam: Neck: no adenopathy, no carotid bruit, no JVD and supple, symmetrical, trachea midline Lungs: clear to auscultation bilaterally Heart: regular rate and rhythm, S1, S2 normal, no murmur, click, rub or gallop Abdomen: soft, non-tender; bowel sounds normal; no masses,  no organomegaly Extremities: extremities normal, atraumatic, no cyanosis or edema  Lab Results:  Recent Labs  08/09/12 1944 08/10/12 0115  WBC 7.7 7.2  HGB 14.4 14.4  PLT 295 278    Recent Labs  08/09/12 1258 08/09/12 1944 08/10/12 0115  NA 136  --  138  K 4.6  --  4.5  CL 101  --  102  CO2 24  --  28  GLUCOSE 139*  --  109*  BUN 17  --  16  CREATININE 1.02 0.97 1.06    Recent Labs  08/10/12 0115 08/10/12 0747  TROPONINI <0.30 <0.30   Hepatic Function Panel  Recent Labs  08/09/12 1258  PROT 7.4  ALBUMIN 3.9  AST 24  ALT 32  ALKPHOS 78  BILITOT 0.7  BILIDIR 0.1  IBILI 0.6    Recent Labs  08/10/12 0115  CHOL 208*   No results found for this basename: PROTIME,  in the last 72 hours  Imaging: Imaging results have been reviewed and Dg Chest 2 View  08/09/2012   *RADIOLOGY REPORT*  Clinical Data: Loss of consciousness.  CHEST - 2 VIEW  Comparison: 12/28/2011  Findings: Lung volume is normal.  Lungs are clear without infiltrate or effusion.  No heart failure or mass lesion.   IMPRESSION: Negative   Original Report Authenticated By: Janeece Riggers, M.D.   Ct Head Wo Contrast  08/09/2012   *RADIOLOGY REPORT*  Clinical Data:  Severe headache  CT HEAD WITHOUT CONTRAST  Technique:  Contiguous axial images were obtained from the base of the skull through the vertex without contrast  Comparison:  None.  Findings:  The brain has a normal appearance without evidence for hemorrhage, acute infarction, hydrocephalus, or mass lesion.  There is no extra axial fluid collection.  The skull and paranasal sinuses are normal.  IMPRESSION: Normal CT of the head without contrast.   Original Report Authenticated By: Janeece Riggers, M.D.    Cardiac Studies:  Assessment/Plan:  Exertional abdominal pain with diaphoresis rule out coronary insufficiency  Status post questionable TIA  Hypertension  Hypercholesteremia Glucose intolerance Plan Schedule for a stress Myoview today Low-cholesterol low-salt 1800 calories ADA diet  Dietary consult .  LOS: 1 day    Dorathea Faerber N 08/10/2012, 10:01 AM

## 2012-08-11 NOTE — Discharge Summary (Signed)
[name] [name], SPICKLER NO.:  [id number]  MEDICAL RECORD NO.:  [id number]  LOCATION:  [id number]                        FACILITY:  [facility]  PHYSICIAN:  Eduardo Osier. Sharyn Lull, M.D. DATE OF BIRTH:  Dec 31, 1970  DATE OF ADMISSION:  08/09/2012 DATE OF DISCHARGE:  08/10/2012                              DISCHARGE SUMMARY   ADMITTING DIAGNOSES: 1. Exertional abdominal pain associated with diaphoresis.  Rule out     coronary insufficiency.  Status post questionable transient     ischemic attack. 2. Hypertension. 3. Hypercholesteremia.  FINAL DIAGNOSES: 1. Status post exertional abdominal pain/diaphoresis, myocardial     infarction ruled out, negative stress Myoview. 2. Status post questionable transient ischemic attack, workup     negative. 3. Hypertension. 4. Glucose intolerance. 5. Hypercholesteremia.  DISCHARGE HOME MEDICATIONS:  Enteric-coated aspirin 81 mg 1 tablet daily, enalapril 20 mg 1 tablet daily, Lopid 600 mg twice daily, Bystolic 5 mg 1 tablet daily as before, and albuterol inhaler as needed as before.  DIET:  Low salt, low cholesterol, 1800 calories ADA diet.  The patient has been advised to avoid sweets and watch his carbohydrates.  CONDITION AT DISCHARGE:  Stable.  FOLLOWUP:  Follow up with me in 1 week.  The patient has been advised regarding diet and lifestyle modification.  BRIEF HISTORY AND HOSPITAL COURSE:  Mr. Pappalardo is a 42 year old male with past medical history significant for hypertension, hypercholesteremia.  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  The patient stopped his vehicle, took a nap for 10 minutes with normalization of the vision. The patient denies any slurred speech.  He denies any weakness in the arms or legs.  He denies any seizure activity.  He denies any syncopal episode.  He denies any chest pain, nausea,  vomiting, or shortness of breath.  He denies palpitation, lightheadedness, or syncopal episode. The patient denies any history of such episodes in the past.  The patient does give history of exertional abdominal pain, relieved with rest in the past.  The patient presently denies any complaints when seen in the ER.  PAST MEDICAL HISTORY:  As above.  PHYSICAL EXAMINATION:  GENERAL:  On examination, he was alert, awake, oriented x3. VITAL SIGNS:  His blood pressure was 106/80, pulse was 61, and he was afebrile. HEENT:  Conjunctiva was pink. NECK:  Supple.  No JVD.  No bruit. LUNGS:  Clear to auscultation without rhonchi or rales. CARDIOVASCULAR:  S1, S2 was normal.  There was soft systolic murmur.  No S3 or S4 gallop.  No pericardial rub. ABDOMEN:  Soft.  Bowel sounds were present.  Nontender. EXTREMITIES:  There is no clubbing, cyanosis, or edema. NEUROLOGIC:  Grossly intact.  LABORATORY DATA:  Sodium was 136, potassium 4.6, BUN was 17, creatinine 1.02, blood sugar was 139, repeat fasting blood sugar today is 109.  His 3 sets of cardiac enzymes were negative.  His cholesterol was 208, triglycerides were elevated 353, LDL was 104, HDL was low 33. Hemoglobin was 14, hematocrit 40.4, white count of 8.4.  DIAGNOSTIC DATA:  CT of the brain done  in the ER showed normal CT of the head without contrast.  The patient also had MRI of the brain, which showed no acute infarct, minimal small-vessel disease type changes, no intracranial hemorrhage, no intracranial mass or abnormal enhancement. Major intracranial vasculature structures were patent.  MR angiography of the neck showed no significant stenosis of carotid arteries or vertebral arteries.  The patient also had stress Myoview, which showed no fixed or reversible defect to suggest inducible ischemia.  Normal ejection fraction of 67%.  The patient did exercise for 10 minutes on Bruce protocol without any EKG changes or chest pain during  the stress test.  BRIEF HOSPITAL COURSE:  The patient was admitted to telemetry unit.  MI was ruled out by serial enzymes and EKG.  The patient subsequently underwent stress test as above, also had CT of the brain and MRI which the results were as above.  The patient did not have any further episodes of abdominal pain, diaphoresis, or any blurring of vision.  The patient has been ambulating in room without any problems.  The patient will be discharged home on above medications and will be followed up in my office in 1 week.     Eduardo Osier. Sharyn Lull, M.D.     [facility]/MEDQ  D:  08/10/2012  T:  08/10/2012  Job:  [id number]

## 2014-02-01 DIAGNOSIS — R079 Chest pain, unspecified: Secondary | ICD-10-CM

## 2014-02-01 HISTORY — DX: Chest pain, unspecified: R07.9

## 2014-02-23 ENCOUNTER — Emergency Department (HOSPITAL_COMMUNITY): Payer: BLUE CROSS/BLUE SHIELD

## 2014-02-23 ENCOUNTER — Inpatient Hospital Stay (HOSPITAL_COMMUNITY)
Admission: EM | Admit: 2014-02-23 | Discharge: 2014-02-25 | DRG: 282 | Disposition: A | Discharge status: Home / Self Care | Payer: BLUE CROSS/BLUE SHIELD | Attending: Cardiology | Admitting: Cardiology

## 2014-02-23 ENCOUNTER — Encounter (HOSPITAL_COMMUNITY): Payer: Self-pay | Admitting: Neurology

## 2014-02-23 DIAGNOSIS — I214 Non-ST elevation (NSTEMI) myocardial infarction: Secondary | ICD-10-CM | POA: Diagnosis not present

## 2014-02-23 DIAGNOSIS — E119 Type 2 diabetes mellitus without complications: Secondary | ICD-10-CM | POA: Diagnosis present

## 2014-02-23 DIAGNOSIS — Z79899 Other long term (current) drug therapy: Secondary | ICD-10-CM

## 2014-02-23 DIAGNOSIS — R079 Chest pain, unspecified: Secondary | ICD-10-CM

## 2014-02-23 DIAGNOSIS — Z7902 Long term (current) use of antithrombotics/antiplatelets: Secondary | ICD-10-CM

## 2014-02-23 DIAGNOSIS — E7800 Pure hypercholesterolemia, unspecified: Secondary | ICD-10-CM

## 2014-02-23 DIAGNOSIS — Z7982 Long term (current) use of aspirin: Secondary | ICD-10-CM

## 2014-02-23 DIAGNOSIS — I10 Essential (primary) hypertension: Secondary | ICD-10-CM | POA: Diagnosis present

## 2014-02-23 HISTORY — DX: Headache, unspecified: R51.9

## 2014-02-23 HISTORY — DX: Diverticulitis of intestine, part unspecified, without perforation or abscess without bleeding: K57.92

## 2014-02-23 HISTORY — DX: Type 2 diabetes mellitus without complications: E11.9

## 2014-02-23 HISTORY — DX: Headache: R51

## 2014-02-23 HISTORY — DX: Chest pain, unspecified: R07.9

## 2014-02-23 LAB — CBC
HCT: 39.2 % (ref 39.0–52.0)
HEMOGLOBIN: 13.7 g/dL (ref 13.0–17.0)
MCH: 28.9 pg (ref 26.0–34.0)
MCHC: 34.9 g/dL (ref 30.0–36.0)
MCV: 82.7 fL (ref 78.0–100.0)
PLATELETS: 277 10*3/uL (ref 150–400)
RBC: 4.74 MIL/uL (ref 4.22–5.81)
RDW: 12.5 % (ref 11.5–15.5)
WBC: 9.3 10*3/uL (ref 4.0–10.5)

## 2014-02-23 LAB — BASIC METABOLIC PANEL
ANION GAP: 6 (ref 5–15)
BUN: 14 mg/dL (ref 6–23)
CALCIUM: 9.1 mg/dL (ref 8.4–10.5)
CHLORIDE: 101 mmol/L (ref 96–112)
CO2: 28 mmol/L (ref 19–32)
CREATININE: 0.93 mg/dL (ref 0.50–1.35)
Glucose, Bld: 179 mg/dL — ABNORMAL HIGH (ref 70–99)
Potassium: 4 mmol/L (ref 3.5–5.1)
Sodium: 135 mmol/L (ref 135–145)

## 2014-02-23 LAB — I-STAT TROPONIN, ED
TROPONIN I, POC: 0.02 ng/mL (ref 0.00–0.08)
TROPONIN I, POC: 0.02 ng/mL (ref 0.00–0.08)
Troponin i, poc: 0.01 ng/mL (ref 0.00–0.08)

## 2014-02-23 LAB — GLUCOSE, CAPILLARY: GLUCOSE-CAPILLARY: 125 mg/dL — AB (ref 70–99)

## 2014-02-23 MED ORDER — HEPARIN BOLUS VIA INFUSION
4000.0000 [IU] | Freq: Once | INTRAVENOUS | Status: AC
  Administered 2014-02-23: 4000 [IU] via INTRAVENOUS
  Filled 2014-02-23: qty 4000

## 2014-02-23 MED ORDER — PANTOPRAZOLE SODIUM 40 MG PO TBEC
40.0000 mg | DELAYED_RELEASE_TABLET | Freq: Every day | ORAL | Status: DC
  Administered 2014-02-24 – 2014-02-25 (×2): 40 mg via ORAL
  Filled 2014-02-23 (×2): qty 1

## 2014-02-23 MED ORDER — ASPIRIN 81 MG PO CHEW
324.0000 mg | CHEWABLE_TABLET | Freq: Once | ORAL | Status: AC
  Administered 2014-02-23: 324 mg via ORAL
  Filled 2014-02-23: qty 4

## 2014-02-23 MED ORDER — GEMFIBROZIL 600 MG PO TABS
600.0000 mg | ORAL_TABLET | Freq: Every day | ORAL | Status: DC
  Administered 2014-02-24 – 2014-02-25 (×2): 600 mg via ORAL
  Filled 2014-02-23 (×2): qty 1

## 2014-02-23 MED ORDER — INSULIN ASPART 100 UNIT/ML ~~LOC~~ SOLN
0.0000 [IU] | Freq: Three times a day (TID) | SUBCUTANEOUS | Status: DC
  Administered 2014-02-24: 1 [IU] via SUBCUTANEOUS

## 2014-02-23 MED ORDER — ASPIRIN 300 MG RE SUPP: 300.0000 mg | RECTAL | Status: AC

## 2014-02-23 MED ORDER — ACETAMINOPHEN 325 MG PO TABS
650.0000 mg | ORAL_TABLET | ORAL | Status: DC | PRN
  Administered 2014-02-24: 650 mg via ORAL
  Filled 2014-02-23: qty 2

## 2014-02-23 MED ORDER — ONDANSETRON HCL 4 MG/2ML IJ SOLN
4.0000 mg | Freq: Once | INTRAMUSCULAR | Status: AC
  Administered 2014-02-23: 4 mg via INTRAVENOUS
  Filled 2014-02-23: qty 2

## 2014-02-23 MED ORDER — ONDANSETRON HCL 4 MG/2ML IJ SOLN: 4.0000 mg | Freq: Four times a day (QID) | INTRAMUSCULAR | Status: DC | PRN

## 2014-02-23 MED ORDER — NITROGLYCERIN 0.4 MG SL SUBL: 0.4000 mg | SUBLINGUAL_TABLET | SUBLINGUAL | Status: DC | PRN

## 2014-02-23 MED ORDER — ASPIRIN 81 MG PO CHEW: 324.0000 mg | CHEWABLE_TABLET | ORAL | Status: AC

## 2014-02-23 MED ORDER — IOHEXOL 350 MG/ML SOLN
100.0000 mL | Freq: Once | INTRAVENOUS | Status: AC | PRN
  Administered 2014-02-23: 100 mL via INTRAVENOUS

## 2014-02-23 MED ORDER — MORPHINE SULFATE 2 MG/ML IJ SOLN
2.0000 mg | Freq: Once | INTRAMUSCULAR | Status: AC
  Administered 2014-02-23: 2 mg via INTRAVENOUS
  Filled 2014-02-23: qty 1

## 2014-02-23 MED ORDER — METOPROLOL TARTRATE 12.5 MG HALF TABLET
12.5000 mg | ORAL_TABLET | Freq: Two times a day (BID) | ORAL | Status: DC
  Administered 2014-02-23 – 2014-02-25 (×3): 12.5 mg via ORAL
  Filled 2014-02-23 (×3): qty 1

## 2014-02-23 MED ORDER — NITROGLYCERIN 2 % TD OINT
1.0000 [in_us] | TOPICAL_OINTMENT | Freq: Once | TRANSDERMAL | Status: AC
  Administered 2014-02-23: 1 [in_us] via TOPICAL
  Filled 2014-02-23: qty 1

## 2014-02-23 MED ORDER — ASPIRIN EC 81 MG PO TBEC
81.0000 mg | DELAYED_RELEASE_TABLET | Freq: Every day | ORAL | Status: DC
Start: 2014-02-24 — End: 2014-02-25
  Administered 2014-02-24 – 2014-02-25 (×2): 81 mg via ORAL
  Filled 2014-02-23 (×2): qty 1

## 2014-02-23 MED ORDER — NITROGLYCERIN 2 % TD OINT
0.5000 [in_us] | TOPICAL_OINTMENT | Freq: Four times a day (QID) | TRANSDERMAL | Status: DC
  Administered 2014-02-23: 0.5 [in_us] via TOPICAL
  Filled 2014-02-23: qty 30

## 2014-02-23 MED ORDER — PANTOPRAZOLE SODIUM 40 MG PO TBEC
40.0000 mg | DELAYED_RELEASE_TABLET | Freq: Once | ORAL | Status: AC
  Administered 2014-02-23: 40 mg via ORAL
  Filled 2014-02-23: qty 1

## 2014-02-23 MED ORDER — SODIUM CHLORIDE 0.9 % IV SOLN: INTRAVENOUS | Status: DC

## 2014-02-23 MED ORDER — ASPIRIN EC 81 MG PO TBEC: 81.0000 mg | DELAYED_RELEASE_TABLET | Freq: Every day | ORAL | Status: DC

## 2014-02-23 MED ORDER — ATORVASTATIN CALCIUM 40 MG PO TABS: 40.0000 mg | ORAL_TABLET | Freq: Every day | ORAL | Status: DC

## 2014-02-23 MED ORDER — HEPARIN (PORCINE) IN NACL 100-0.45 UNIT/ML-% IJ SOLN
1200.0000 [IU]/h | INTRAMUSCULAR | Status: DC
  Administered 2014-02-23: 1200 [IU]/h via INTRAVENOUS
  Filled 2014-02-23: qty 250

## 2014-02-23 MED ORDER — MORPHINE SULFATE 4 MG/ML IJ SOLN
4.0000 mg | Freq: Once | INTRAMUSCULAR | Status: AC
  Administered 2014-02-23: 4 mg via INTRAVENOUS
  Filled 2014-02-23: qty 1

## 2014-02-23 NOTE — ED Notes (Signed)
Attempted report 

## 2014-02-23 NOTE — ED Notes (Signed)
Patient transported to CT 

## 2014-02-23 NOTE — H&P (Signed)
[name] [name] is an 44 y.o. male.   Chief Complaint: Recurrent chest pain HPI: Patient is 43 year old Asian male with past medical history significant for hypertension and type 2 diabetes mellitus, hypercholesterolemia, Came to the ER complaining of left-sided and retrosternal chest pain described as pressure radiating to left arm while in the meeting early this morning associated with diaphoresis states chest pain was 10 over 10 as if someone is sitting on the chest lasting approximately 20-25 minutes. Denies any palpitation lightheadedness or syncope. Again had similar chest pain this afternoon and this evening radiating to the left arm associated with numbness decided to come to the ED for further evaluation. EKG done in the ER shows no acute ischemic changes first 2 sets of troponin I are negative. Patient also had CT in June of the chest in the field which showed no evidence of dissection. Patient denies any shortness of breath. Denies PND orthopnea leg swelling. Denies any cough fever or chills. Denies relation of chest pain to food or breathing. Past Medical History  Diagnosis Date  . Hypertension   . Diverticulitis   . Diabetes mellitus without complication     History reviewed. No pertinent past surgical history.  No family history on file. Social History:  reports that he has never smoked. He does not have any smokeless tobacco history on file. He reports that he does not drink alcohol or use illicit drugs.  Allergies: No Known Allergies   (Not in a hospital admission)  Results for orders placed or performed during the hospital encounter of 02/23/14 (from the past 48 hour(s))  CBC     Status: None   Collection Time: 02/23/14  4:33 PM  Result Value Ref Range   WBC 9.3 4.0 - 10.5 K/uL   RBC 4.74 4.22 - 5.81 MIL/uL   Hemoglobin 13.7 13.0 - 17.0 g/dL   HCT 39.2 39.0 - 52.0 %   MCV 82.7 78.0 - 100.0 fL   MCH 28.9 26.0 - 34.0 pg   MCHC 34.9 30.0 - 36.0 g/dL   RDW 12.5 11.5 - 15.5  %   Platelets 277 150 - 400 K/uL  Basic metabolic panel     Status: Abnormal   Collection Time: 02/23/14  4:33 PM  Result Value Ref Range   Sodium 135 135 - 145 mmol/L   Potassium 4.0 3.5 - 5.1 mmol/L   Chloride 101 96 - 112 mmol/L   CO2 28 19 - 32 mmol/L   Glucose, Bld 179 (H) 70 - 99 mg/dL   BUN 14 6 - 23 mg/dL   Creatinine, Ser 0.93 0.50 - 1.35 mg/dL   Calcium 9.1 8.4 - 10.5 mg/dL   GFR calc non Af Amer >90 >90 mL/min   GFR calc Af Amer >90 >90 mL/min    Comment: (NOTE) The eGFR has been calculated using the CKD EPI equation. This calculation has not been validated in all clinical situations. eGFR's persistently <90 mL/min signify possible Chronic Kidney Disease.    Anion gap 6 5 - 15  I-stat troponin, ED (not at [facility])     Status: None   Collection Time: 02/23/14  4:48 PM  Result Value Ref Range   Troponin i, poc 0.01 0.00 - 0.08 ng/mL   Comment 3            Comment: Due to the release kinetics of cTnI, a negative result within the first hours of the onset of symptoms does not rule out myocardial infarction with certainty. If myocardial  infarction is still suspected, repeat the test at appropriate intervals.   I-stat troponin, ED     Status: None   Collection Time: 02/23/14  6:54 PM  Result Value Ref Range   Troponin i, poc 0.02 0.00 - 0.08 ng/mL   Comment 3            Comment: Due to the release kinetics of cTnI, a negative result within the first hours of the onset of symptoms does not rule out myocardial infarction with certainty. If myocardial infarction is still suspected, repeat the test at appropriate intervals.   I-stat troponin, ED     Status: None   Collection Time: 02/23/14  7:12 PM  Result Value Ref Range   Troponin i, poc 0.02 0.00 - 0.08 ng/mL   Comment 3            Comment: Due to the release kinetics of cTnI, a negative result within the first hours of the onset of symptoms does not rule out myocardial infarction with certainty. If myocardial  infarction is still suspected, repeat the test at appropriate intervals.    Dg Chest Port 1 View  02/23/2014   CLINICAL DATA:  Left-sided chest pain with radiation into the left arm region.  EXAM: PORTABLE CHEST - 1 VIEW  COMPARISON:  August 09, 2012  FINDINGS: There is no edema or consolidation. Heart is upper normal in size with pulmonary vascularity within normal limits. No adenopathy. No bone lesions. No pneumothorax.  IMPRESSION: No edema or consolidation.   Electronically Signed   By: Lowella Grip III M.D.   On: 02/23/2014 17:10   Ct Angio Chest Aorta W/cm &/or Wo/cm  02/23/2014   CLINICAL DATA:  Left-sided chest pain onset 30 min ago with radiation down the left arm. Diaphoresis.  EXAM: CT ANGIOGRAPHY CHEST WITH CONTRAST  TECHNIQUE: Multidetector CT imaging of the chest was performed using the standard protocol during bolus administration of intravenous contrast. Multiplanar CT image reconstructions and MIPs were obtained to evaluate the vascular anatomy.  CONTRAST:  179m OMNIPAQUE IOHEXOL 350 MG/ML SOLN  COMPARISON:  Chest 02/23/2014  FINDINGS: Unenhanced images obtained of the chest demonstrate normal caliber thoracic aorta. No significant aortic or Coronary artery calcifications. No evidence of intramural hematoma.  Arterial phase contrast-enhanced images of the chest demonstrate normal caliber thoracic aorta. No aortic dissection or aneurysm. Great vessel origins are patent. Central pulmonary arteries are well opacified without evidence of significant pulmonary embolus.  Normal heart size. Esophagus is decompressed. No significant lymphadenopathy in the chest. Calcified lymph nodes present in the right hilum and right mediastinum consistent postinflammatory change.  Slight interstitial pattern to the peripheral lungs may represent fibrosis or atelectasis. No focal consolidation. No pleural effusions. No pneumothorax. Airways appear patent. No destructive bone lesions. Included portions of the  upper abdominal organs demonstrate diffuse fatty infiltration of the liver. Subcentimeter left renal cyst.  Review of the MIP images confirms the above findings.  IMPRESSION: No evidence of aneurysm or dissection of the thoracic aorta.   Electronically Signed   By: WLucienne CapersM.D.   On: 02/23/2014 19:56    Review of Systems  Constitutional: Positive for diaphoresis. Negative for fever and chills.  Eyes: Negative for blurred vision and double vision.  Respiratory: Negative for cough, sputum production and shortness of breath.   Cardiovascular: Positive for chest pain. Negative for palpitations, orthopnea, claudication and leg swelling.  Gastrointestinal: Negative for nausea, vomiting and abdominal pain.  Genitourinary: Negative for dysuria.  Musculoskeletal:  Negative for myalgias and back pain.  Neurological: Negative for dizziness and headaches.    Blood pressure 121/81, pulse 75, temperature 97.6 F (36.4 C), resp. rate 16, height _0  (1.727 m), weight 93.895 kg (207 lb), SpO2 96 %. Physical Exam  Constitutional: He is oriented to person, place, and time. He appears well-developed and well-nourished.  HENT:  Head: Normocephalic and atraumatic.  Eyes: Conjunctivae are normal. Pupils are equal, round, and reactive to light. Left eye exhibits no discharge. No scleral icterus.  Neck: Normal range of motion. Neck supple. No JVD present. No tracheal deviation present. No thyromegaly present.  Cardiovascular: Normal rate and regular rhythm.  Exam reveals no friction rub.   Murmur (Soft systolic murmur noted no S3 gallop) heard. Respiratory: Breath sounds normal. No respiratory distress. He has no wheezes. He has no rales. He exhibits no tenderness.  GI: Soft. Bowel sounds are normal. He exhibits no distension. There is no tenderness. There is no rebound.  Musculoskeletal: He exhibits no edema or tenderness.  Neurological: He is alert and oriented to person, place, and time.      Assessment/Plan Recurrent chest pain with features worrisome for angina rule out coronary insufficiency Hypertension Diabetes mellitus Hypercholesteremia History of diverticulitis Plan As per orders Will schedule for stress Myoview in a.m.  Clent Demark 02/23/2014, 9:39 PM

## 2014-02-23 NOTE — Progress Notes (Signed)
ANTICOAGULATION CONSULT NOTE - Initial Consult  Pharmacy Consult for Heparin Indication: chest pain/ACS  No Known Allergies  Patient Measurements: Height: 5\' 8"  (172.7 cm) Weight: 207 lb (93.895 kg) IBW/kg (Calculated) : 68.4 Heparin Dosing Weight: 88 kg  Vital Signs: Temp: 97.6 F (36.4 C) (02/23 1632) BP: 121/81 mmHg (02/23 2100) Pulse Rate: 75 (02/23 2100)  Labs:  Recent Labs  02/23/14 1633  HGB 13.7  HCT 39.2  PLT 277  CREATININE 0.93    CrCl cannot be calculated (Unknown ideal weight.).   Medical History: Past Medical History  Diagnosis Date  . Hypertension   . Diverticulitis   . Diabetes mellitus without complication     Medications:   (Not in a hospital admission) Scheduled:  Infusions:   Assessment: 44yo male with history of HTN and DM2 presents with several episodes of chest pain today. Pharmacy is consulted to dose heparin for ACS/chest pain. Trop 0.01-0.02, CBC wnl.  Goal of Therapy:  Heparin level 0.3-0.7 units/ml Monitor platelets by anticoagulation protocol: Yes   Plan:  Give 4000 units bolus x 1 Start heparin infusion at 1200 units/hr Check anti-Xa level in 6 hours and daily while on heparin Continue to monitor H&H and platelets  Monitor s/sx of bleeding  Arlean Hoppingorey M. Newman PiesBall, PharmD Clinical Pharmacist Pager [phone] 02/23/2014,9:25 PM

## 2014-02-23 NOTE — ED Notes (Signed)
Pt reports left sided cp onset 30 mins ago. Radiation down to left arm. Reports diaphoresis. Feels like something is sitting on his chest. Denies cardiac hx. Pt appears weak, appears in pain at triage.

## 2014-02-23 NOTE — ED Provider Notes (Signed)
CSN: [id number]     Arrival date & time 02/23/14  1627 History   First MD Initiated Contact with Patient 02/23/14 1635     Chief Complaint  Patient presents with  . Chest Pain     (Consider location/radiation/quality/duration/timing/severity/associated sxs/prior Treatment) HPI The patient reports several episodes of chest pain today. The first episode was in the morning meeting. He reports he got a heavy pressure on the left side of his chest and an intense pain into his left arm. He also felt sweaty with this. After about 20 minutes this improved. He went on to his next meeting and tried to have a couple coffee and again got some left-sided chest pressure and a radiation to the arm. Again it resolved. Subsequently while driving he got a much more intense pain over the left chest and became very sweaty and had pain again into the left arm. At that time he determined to come to the emergency department for evaluation. The patient denies any history of similar problems. He has not been ill leading up to this event. He does not smoke. He has no lower extremity swelling or calf pain. 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 He has no prior cardiac or pulmonary history. The patient does have baseline hypertension.  Family history: He denies any early onset of coronary artery disease or sudden death in parents or siblings. Past Medical History  Diagnosis Date  . Hypertension   . Diverticulitis   . Diabetes mellitus without complication    History reviewed. No pertinent past surgical history. No family history on file. History  Substance Use Topics  . Smoking status: Never Smoker   . Smokeless tobacco: Not on file  . Alcohol Use: No    Review of Systems 10 Systems reviewed and are negative for acute change except as noted in the HPI.    Allergies  Review of patient's allergies indicates no known allergies.  Home Medications    Prior to Admission medications   Medication Sig Start Date End Date Taking? Authorizing Provider  enalapril (VASOTEC) 20 MG tablet Take 20 mg by mouth daily.   Yes Historical Provider, MD  gemfibrozil (LOPID) 600 MG tablet Take 600 mg by mouth daily.    Yes Historical Provider, MD  Multiple Vitamin (MULTIVITAMINS PO) Take 1 tablet by mouth daily.   Yes Historical Provider, MD  nebivolol (BYSTOLIC) 5 MG tablet Take 5 mg by mouth daily.   Yes Historical Provider, MD  albuterol (PROVENTIL HFA;VENTOLIN HFA) 108 (90 BASE) MCG/ACT inhaler Inhale 1-2 puffs into the lungs every 6 (six) hours as needed for wheezing.    Historical Provider, MD  aspirin EC 81 MG EC tablet Take 1 tablet (81 mg total) by mouth daily. Patient not taking: Reported on 02/23/2014 08/10/12   Robynn Pane, MD   BP 126/86 mmHg  Pulse 79  Temp(Src) 97.6 F (36.4 C)  Resp 16  SpO2 98% Physical Exam  Constitutional: He is oriented to person, place, and time. He appears well-developed and well-nourished.  HENT:  Head: Normocephalic and atraumatic.  Eyes: EOM are normal. Pupils are equal, round, and reactive to light.  Neck: Neck supple.  Cardiovascular: Normal rate, regular rhythm, normal heart sounds and intact distal pulses.   Pulmonary/Chest: Effort normal and breath sounds normal.  Abdominal: Soft. Bowel sounds are normal. He exhibits no distension. There is no tenderness.  Musculoskeletal: Normal range of motion.  He exhibits no edema.  Neurological: He is alert and oriented to person, place, and time. He has normal strength. Coordination normal. GCS eye subscore is 4. GCS verbal subscore is 5. GCS motor subscore is 6.  Skin: Skin is warm, dry and intact.  Psychiatric: He has a normal mood and affect.    ED Course  Procedures (including critical care time) Labs Review Labs Reviewed  BASIC METABOLIC PANEL - Abnormal; Notable for the following:    Glucose, Bld 179 (*)    All other components within normal limits   CBC  I-STAT TROPOININ, ED  Rosezena SensorI-STAT TROPOININ, ED  Rosezena SensorI-STAT TROPOININ, ED    Imaging Review Dg Chest Port 1 View  02/23/2014   CLINICAL DATA:  Left-sided chest pain with radiation into the left arm region.  EXAM: PORTABLE CHEST - 1 VIEW  COMPARISON:  August 09, 2012  FINDINGS: There is no edema or consolidation. Heart is upper normal in size with pulmonary vascularity within normal limits. No adenopathy. No bone lesions. No pneumothorax.  IMPRESSION: No edema or consolidation.   Electronically Signed   By: Bretta BangWilliam  Woodruff III M.D.   On: 02/23/2014 17:10   Ct Angio Chest Aorta W/cm &/or Wo/cm  02/23/2014   CLINICAL DATA:  Left-sided chest pain onset 30 min ago with radiation down the left arm. Diaphoresis.  EXAM: CT ANGIOGRAPHY CHEST WITH CONTRAST  TECHNIQUE: Multidetector CT imaging of the chest was performed using the standard protocol during bolus administration of intravenous contrast. Multiplanar CT image reconstructions and MIPs were obtained to evaluate the vascular anatomy.  CONTRAST:  100mL OMNIPAQUE IOHEXOL 350 MG/ML SOLN  COMPARISON:  Chest 02/23/2014  FINDINGS: Unenhanced images obtained of the chest demonstrate normal caliber thoracic aorta. No significant aortic or Coronary artery calcifications. No evidence of intramural hematoma.  Arterial phase contrast-enhanced images of the chest demonstrate normal caliber thoracic aorta. No aortic dissection or aneurysm. Great vessel origins are patent. Central pulmonary arteries are well opacified without evidence of significant pulmonary embolus.  Normal heart size. Esophagus is decompressed. No significant lymphadenopathy in the chest. Calcified lymph nodes present in the right hilum and right mediastinum consistent postinflammatory change.  Slight interstitial pattern to the peripheral lungs may represent fibrosis or atelectasis. No focal consolidation. No pleural effusions. No pneumothorax. Airways appear patent. No destructive bone lesions.  Included portions of the upper abdominal organs demonstrate diffuse fatty infiltration of the liver. Subcentimeter left renal cyst.  Review of the MIP images confirms the above findings.  IMPRESSION: No evidence of aneurysm or dissection of the thoracic aorta.   Electronically Signed   By: Burman NievesWilliam  Stevens M.D.   On: 02/23/2014 19:56     EKG Interpretation   Date/Time:  Tuesday February 23 2014 16:35:21 EST Ventricular Rate:  81 PR Interval:  198 QRS Duration: 82 QT Interval:  384 QTC Calculation: 446 R Axis:   58 Text Interpretation:  Normal sinus rhythm Normal ECG AGREE  Confirmed by  Donnald GarrePfeiffer, MD, Lebron ConnersMarcy [phone] on 02/23/2014 5:51:11 PM     20:55 consult with Dr. Sharyn LullHarwani. He suggested starting the patient on heparin and he will plan on admitting the patient and doing stress test tomorrow.  MDM   Final diagnoses:  Chest pain, unspecified chest pain type  Essential hypertension  Hypercholesterolemia   The patient presents with several episodes of left-sided chest pain and pressure with radiation to the arm. At this time cardiac enzymes are negative and EKG does not show acute MI. CT angios of the  chest shows normal aorta and intrathoracic contents. Based on the patient's history and known hypertension and cholesterolemia the patient will be admitted by recommendation of Dr. Sharyn Lull on heparin for further diagnostic evaluation.    Arby Barrette, MD 02/23/14 [phone]

## 2014-02-23 NOTE — ED Notes (Signed)
Patient transported to X-ray 

## 2014-02-24 ENCOUNTER — Encounter (HOSPITAL_COMMUNITY)
Admission: EM | Disposition: A | Discharge status: Home / Self Care | Payer: Self-pay | Source: Home / Self Care | Attending: Cardiology

## 2014-02-24 ENCOUNTER — Encounter (HOSPITAL_COMMUNITY): Payer: Self-pay | Admitting: General Practice

## 2014-02-24 DIAGNOSIS — Z7982 Long term (current) use of aspirin: Secondary | ICD-10-CM | POA: Diagnosis not present

## 2014-02-24 DIAGNOSIS — Z7902 Long term (current) use of antithrombotics/antiplatelets: Secondary | ICD-10-CM | POA: Diagnosis not present

## 2014-02-24 DIAGNOSIS — E780 Pure hypercholesterolemia: Secondary | ICD-10-CM | POA: Diagnosis present

## 2014-02-24 DIAGNOSIS — I214 Non-ST elevation (NSTEMI) myocardial infarction: Secondary | ICD-10-CM | POA: Diagnosis present

## 2014-02-24 DIAGNOSIS — E119 Type 2 diabetes mellitus without complications: Secondary | ICD-10-CM | POA: Diagnosis present

## 2014-02-24 DIAGNOSIS — I10 Essential (primary) hypertension: Secondary | ICD-10-CM | POA: Diagnosis present

## 2014-02-24 DIAGNOSIS — R079 Chest pain, unspecified: Secondary | ICD-10-CM | POA: Diagnosis present

## 2014-02-24 DIAGNOSIS — Z79899 Other long term (current) drug therapy: Secondary | ICD-10-CM | POA: Diagnosis not present

## 2014-02-24 HISTORY — PX: LEFT HEART CATHETERIZATION WITH CORONARY ANGIOGRAM: SHX5451

## 2014-02-24 LAB — LIPID PANEL
Cholesterol: 180 mg/dL (ref 0–200)
HDL: 32 mg/dL — ABNORMAL LOW (ref >39)
LDL Cholesterol: 94 mg/dL (ref 0–99)
Total CHOL/HDL Ratio: 5.6 RATIO
Triglycerides: 269 mg/dL — ABNORMAL HIGH (ref <150)
VLDL: 54 mg/dL — ABNORMAL HIGH (ref 0–40)

## 2014-02-24 LAB — PROTIME-INR
INR: 0.98 (ref 0.00–1.49)
PROTHROMBIN TIME: 13.1 s (ref 11.6–15.2)

## 2014-02-24 LAB — TROPONIN I
Troponin I: 2.5 ng/mL (ref <0.031)
Troponin I: 7.13 ng/mL (ref <0.031)

## 2014-02-24 LAB — COMPREHENSIVE METABOLIC PANEL
ALK PHOS: 62 U/L (ref 39–117)
ALT: 33 U/L (ref 0–53)
ANION GAP: 4 — AB (ref 5–15)
AST: 32 U/L (ref 0–37)
Albumin: 3.7 g/dL (ref 3.5–5.2)
BUN: 12 mg/dL (ref 6–23)
CHLORIDE: 102 mmol/L (ref 96–112)
CO2: 30 mmol/L (ref 19–32)
CREATININE: 0.96 mg/dL (ref 0.50–1.35)
Calcium: 8.6 mg/dL (ref 8.4–10.5)
GLUCOSE: 151 mg/dL — AB (ref 70–99)
POTASSIUM: 3.6 mmol/L (ref 3.5–5.1)
Sodium: 136 mmol/L (ref 135–145)
Total Bilirubin: 0.9 mg/dL (ref 0.3–1.2)
Total Protein: 6.1 g/dL (ref 6.0–8.3)

## 2014-02-24 LAB — CBC
HCT: 36.7 % — ABNORMAL LOW (ref 39.0–52.0)
HEMOGLOBIN: 12.6 g/dL — AB (ref 13.0–17.0)
MCH: 28.8 pg (ref 26.0–34.0)
MCHC: 34.3 g/dL (ref 30.0–36.0)
MCV: 83.8 fL (ref 78.0–100.0)
PLATELETS: 256 10*3/uL (ref 150–400)
RBC: 4.38 MIL/uL (ref 4.22–5.81)
RDW: 12.6 % (ref 11.5–15.5)
WBC: 7.5 10*3/uL (ref 4.0–10.5)

## 2014-02-24 LAB — TSH: TSH: 0.519 u[IU]/mL (ref 0.350–4.500)

## 2014-02-24 LAB — GLUCOSE, CAPILLARY
GLUCOSE-CAPILLARY: 124 mg/dL — AB (ref 70–99)
GLUCOSE-CAPILLARY: 112 mg/dL — AB (ref 70–99)
Glucose-Capillary: 102 mg/dL — ABNORMAL HIGH (ref 70–99)
Glucose-Capillary: 111 mg/dL — ABNORMAL HIGH (ref 70–99)
Glucose-Capillary: 36 mg/dL — CL (ref 70–99)
Glucose-Capillary: 160 mg/dL — ABNORMAL HIGH (ref 70–99)

## 2014-02-24 LAB — MAGNESIUM: MAGNESIUM: 1.9 mg/dL (ref 1.5–2.5)

## 2014-02-24 LAB — HEPARIN LEVEL (UNFRACTIONATED)
Heparin Unfractionated: 0.6 IU/mL (ref 0.30–0.70)
Heparin Unfractionated: 0.5 IU/mL (ref 0.30–0.70)

## 2014-02-24 SURGERY — LEFT HEART CATHETERIZATION WITH CORONARY ANGIOGRAM

## 2014-02-24 MED ORDER — LABETALOL HCL 5 MG/ML IV SOLN
10.0000 mg | Freq: Once | INTRAVENOUS | Status: AC
  Administered 2014-02-24: 10 mg via INTRAVENOUS

## 2014-02-24 MED ORDER — MIDAZOLAM HCL 2 MG/2ML IJ SOLN
INTRAMUSCULAR | Status: AC
  Filled 2014-02-24: qty 2

## 2014-02-24 MED ORDER — RAMIPRIL 5 MG PO CAPS
5.0000 mg | ORAL_CAPSULE | Freq: Every day | ORAL | Status: DC
  Administered 2014-02-24 – 2014-02-25 (×2): 5 mg via ORAL
  Filled 2014-02-24 (×2): qty 1

## 2014-02-24 MED ORDER — TICAGRELOR 90 MG PO TABS: 90.0000 mg | ORAL_TABLET | Freq: Two times a day (BID) | ORAL | Status: DC

## 2014-02-24 MED ORDER — LIDOCAINE HCL (PF) 1 % IJ SOLN
INTRAMUSCULAR | Status: AC
  Filled 2014-02-24: qty 30

## 2014-02-24 MED ORDER — ASPIRIN 81 MG PO CHEW: 81.0000 mg | CHEWABLE_TABLET | ORAL | Status: AC

## 2014-02-24 MED ORDER — TICAGRELOR 90 MG PO TABS
90.0000 mg | ORAL_TABLET | Freq: Two times a day (BID) | ORAL | Status: DC
  Administered 2014-02-24 – 2014-02-25 (×2): 90 mg via ORAL
  Filled 2014-02-24 (×2): qty 1

## 2014-02-24 MED ORDER — PNEUMOCOCCAL VAC POLYVALENT 25 MCG/0.5ML IJ INJ
0.5000 mL | INJECTION | INTRAMUSCULAR | Status: AC
  Administered 2014-02-25: 0.5 mL via INTRAMUSCULAR
  Filled 2014-02-24: qty 0.5

## 2014-02-24 MED ORDER — SODIUM CHLORIDE 0.9 % IV SOLN: 250.0000 mL | INTRAVENOUS | Status: DC | PRN

## 2014-02-24 MED ORDER — SODIUM CHLORIDE 0.9 % IV SOLN: INTRAVENOUS | Status: DC

## 2014-02-24 MED ORDER — SODIUM CHLORIDE 0.9 % IJ SOLN: 3.0000 mL | INTRAMUSCULAR | Status: DC | PRN

## 2014-02-24 MED ORDER — FENTANYL CITRATE 0.05 MG/ML IJ SOLN
INTRAMUSCULAR | Status: AC
  Filled 2014-02-24: qty 2

## 2014-02-24 MED ORDER — INFLUENZA VAC SPLIT QUAD 0.5 ML IM SUSY
0.5000 mL | PREFILLED_SYRINGE | INTRAMUSCULAR | Status: DC
  Filled 2014-02-24: qty 0.5

## 2014-02-24 MED ORDER — ACETAMINOPHEN 325 MG PO TABS: 650.0000 mg | ORAL_TABLET | ORAL | Status: DC | PRN

## 2014-02-24 MED ORDER — LABETALOL HCL 5 MG/ML IV SOLN
INTRAVENOUS | Status: AC
  Filled 2014-02-24: qty 4

## 2014-02-24 MED ORDER — NITROGLYCERIN 1 MG/10 ML FOR IR/CATH LAB
INTRA_ARTERIAL | Status: AC
  Filled 2014-02-24: qty 10

## 2014-02-24 MED ORDER — SODIUM CHLORIDE 0.9 % IJ SOLN: 3.0000 mL | Freq: Two times a day (BID) | INTRAMUSCULAR | Status: DC

## 2014-02-24 MED ORDER — TICAGRELOR 90 MG PO TABS: 180.0000 mg | ORAL_TABLET | ORAL | Status: AC

## 2014-02-24 MED ORDER — ONDANSETRON HCL 4 MG/2ML IJ SOLN: 4.0000 mg | Freq: Four times a day (QID) | INTRAMUSCULAR | Status: DC | PRN

## 2014-02-24 MED ORDER — TICAGRELOR 90 MG PO TABS
180.0000 mg | ORAL_TABLET | Freq: Once | ORAL | Status: AC
  Administered 2014-02-24: 180 mg via ORAL
  Filled 2014-02-24: qty 2

## 2014-02-24 MED ORDER — ASPIRIN 81 MG PO CHEW: 81.0000 mg | CHEWABLE_TABLET | Freq: Every day | ORAL | Status: DC

## 2014-02-24 MED ORDER — SODIUM CHLORIDE 0.9 % IV SOLN
INTRAVENOUS | Status: AC
  Administered 2014-02-24: 150 mL/h via INTRAVENOUS

## 2014-02-24 MED ORDER — HEPARIN (PORCINE) IN NACL 2-0.9 UNIT/ML-% IJ SOLN
INTRAMUSCULAR | Status: AC
  Filled 2014-02-24: qty 1000

## 2014-02-24 NOTE — Interval H&P Note (Signed)
Cath Lab Visit (complete for each Cath Lab visit)  Clinical Evaluation Leading to the Procedure:   ACS: Yes.    Non-ACS:    Anginal Classification: CCS IV  Anti-ischemic medical therapy: Maximal Therapy (2 or more classes of medications)  Non-Invasive Test Results: No non-invasive testing performed  Prior CABG: No previous CABG      History and Physical Interval Note:  02/24/2014 4:14 PM  Lambert Modybdul Hamric  has presented today for surgery, with the diagnosis of cp  The various methods of treatment have been discussed with the patient and family. After consideration of risks, benefits and other options for treatment, the patient has consented to  Procedure(s): LEFT HEART CATHETERIZATION WITH CORONARY ANGIOGRAM (N/A) as a surgical intervention .  The patient's history has been reviewed, patient examined, no change in status, stable for surgery.  I have reviewed the patient's chart and labs.  Questions were answered to the patient's satisfaction.     Robynn PaneHARWANI,Bernadine Melecio N

## 2014-02-24 NOTE — Progress Notes (Signed)
ANTICOAGULATION CONSULT NOTE - Follow Up Consult  Pharmacy Consult for Heparin Indication: chest pain/ACS  No Known Allergies  Patient Measurements: Height: 5\' 8"  (172.7 cm) Weight: 206 lb 11.2 oz (93.759 kg) IBW/kg (Calculated) : 68.4 Heparin Dosing Weight: 88 kg  Vital Signs: Temp: 98.2 F (36.8 C) (02/24 0511) Temp Source: Oral (02/24 0511) BP: 109/72 mmHg (02/24 0511) Pulse Rate: 82 (02/24 0511)  Labs:  Recent Labs  02/23/14 1633 02/24/14 0005 02/24/14 0734  HGB 13.7 12.6*  --   HCT 39.2 36.7*  --   PLT 277 256  --   HEPARINUNFRC  --  0.60 0.50  CREATININE 0.93 0.96  --   TROPONINI  --   --  2.50*    Estimated Creatinine Clearance: 110.3 mL/min (by C-G formula based on Cr of 0.96).   Medications:  Infusions:  . sodium chloride    . sodium chloride    . heparin 1,200 Units/hr (02/23/14 2155)    Assessment: 44 yo M continues on heparin for CP/ACS.  Troponin now positive and plans for cardiac cath today.  Heparin level is therapeutic on 1200 units/hr.  Goal of Therapy:  Heparin level 0.3-0.7 units/ml Monitor platelets by anticoagulation protocol: Yes   Plan:  Continue heparin at 1200 units/hr Follow up after cardiac cath  [facility] Southeast OhioKimberly Tamaya Pun, Pharm.D., BCPS Clinical Pharmacist Pager [phone] 02/24/2014 12:57 PM

## 2014-02-24 NOTE — CV Procedure (Signed)
Left cardiac catheterization report dictated on 02/24/2014 dictation number is [id number]

## 2014-02-24 NOTE — Progress Notes (Signed)
Site area: right groin a 5 french arterial sheath was removed  Site Prior to Removal:  Level 0  Pressure Applied For 20 MINUTES    Minutes Beginning at 1725p  Manual:   Yes.    Patient Status During Pull:  stable  Post Pull Groin Site:  Level 0  Post Pull Instructions Given:  Yes.    Post Pull Pulses Present:  Yes.    Dressing Applied:  Yes.    Comments:  VS remain stable during sheath pull.  Pt denies any discomfort at this time.

## 2014-02-24 NOTE — Progress Notes (Signed)
UR Completed Breniya Goertzen Graves-Bigelow, RN,BSN [phone]  

## 2014-02-24 NOTE — Progress Notes (Signed)
Subjective:  Patient denies any further chest pain. Troponin on this area was mildly positive. EKG this a.m. showed nonspecific T-wave changes.  Objective:  Vital Signs in the last 24 hours: Temp:  [97.6 F (36.4 C)-98.2 F (36.8 C)] 98.2 F (36.8 C) (02/24 0511) Pulse Rate:  [66-91] 82 (02/24 0511) Resp:  [13-18] 16 (02/24 0511) BP: (109-170)/(72-102) 109/72 mmHg (02/24 0511) SpO2:  [96 %-100 %] 96 % (02/24 0511) Weight:  [93.759 kg (206 lb 11.2 oz)-93.895 kg (207 lb)] 93.759 kg (206 lb 11.2 oz) (02/23 2249)  Intake/Output from previous day:   Intake/Output from this shift:    Physical Exam: Neck: no adenopathy, no carotid bruit, no JVD and supple, symmetrical, trachea midline Lungs: clear to auscultation bilaterally Heart: regular rate and rhythm, S1, S2 normal and Soft systolic murmur noted no S3 gallop Abdomen: soft, non-tender; bowel sounds normal; no masses,  no organomegaly Extremities: extremities normal, atraumatic, no cyanosis or edema  Lab Results:  Recent Labs  02/23/14 1633 02/24/14 0005  WBC 9.3 7.5  HGB 13.7 12.6*  PLT 277 256    Recent Labs  02/23/14 1633 02/24/14 0005  NA 135 136  K 4.0 3.6  CL 101 102  CO2 28 30  GLUCOSE 179* 151*  BUN 14 12  CREATININE 0.93 0.96    Recent Labs  02/24/14 0734  TROPONINI 2.50*   Hepatic Function Panel  Recent Labs  02/24/14 0005  PROT 6.1  ALBUMIN 3.7  AST 32  ALT 33  ALKPHOS 62  BILITOT 0.9    Recent Labs  02/24/14 0005  CHOL 180   No results for input(s): PROTIME in the last 72 hours.  Imaging: Imaging results have been reviewed and Dg Chest Port 1 View  02/23/2014   CLINICAL DATA:  Left-sided chest pain with radiation into the left arm region.  EXAM: PORTABLE CHEST - 1 VIEW  COMPARISON:  August 09, 2012  FINDINGS: There is no edema or consolidation. Heart is upper normal in size with pulmonary vascularity within normal limits. No adenopathy. No bone lesions. No pneumothorax.  IMPRESSION:  No edema or consolidation.   Electronically Signed   By: Bretta BangWilliam  Woodruff III M.D.   On: 02/23/2014 17:10   Ct Angio Chest Aorta W/cm &/or Wo/cm  02/23/2014   CLINICAL DATA:  Left-sided chest pain onset 30 min ago with radiation down the left arm. Diaphoresis.  EXAM: CT ANGIOGRAPHY CHEST WITH CONTRAST  TECHNIQUE: Multidetector CT imaging of the chest was performed using the standard protocol during bolus administration of intravenous contrast. Multiplanar CT image reconstructions and MIPs were obtained to evaluate the vascular anatomy.  CONTRAST:  100mL OMNIPAQUE IOHEXOL 350 MG/ML SOLN  COMPARISON:  Chest 02/23/2014  FINDINGS: Unenhanced images obtained of the chest demonstrate normal caliber thoracic aorta. No significant aortic or Coronary artery calcifications. No evidence of intramural hematoma.  Arterial phase contrast-enhanced images of the chest demonstrate normal caliber thoracic aorta. No aortic dissection or aneurysm. Great vessel origins are patent. Central pulmonary arteries are well opacified without evidence of significant pulmonary embolus.  Normal heart size. Esophagus is decompressed. No significant lymphadenopathy in the chest. Calcified lymph nodes present in the right hilum and right mediastinum consistent postinflammatory change.  Slight interstitial pattern to the peripheral lungs may represent fibrosis or atelectasis. No focal consolidation. No pleural effusions. No pneumothorax. Airways appear patent. No destructive bone lesions. Included portions of the upper abdominal organs demonstrate diffuse fatty infiltration of the liver. Subcentimeter left renal cyst.  Review  of the MIP images confirms the above findings.  IMPRESSION: No evidence of aneurysm or dissection of the thoracic aorta.   Electronically Signed   By: Burman Nieves M.D.   On: 02/23/2014 19:56    Cardiac Studies:  Assessment/Plan:  Small non-Q-wave myocardial infarction. Hypertension Diabetes  mellitus Hypercholesteremia History of diverticulitis   plan Discussed with patient at length regarding mildly elevated troponin I and cardiac catheterization and possible PTCA stenting this risk and benefits are reviewed that time of stroke need for emergency CABG local vascular complications risk of restenosis etc. and consents for PCI.   Leya Paige N 02/24/2014, 9:49 AM

## 2014-02-24 NOTE — H&P (View-Only) (Signed)
Subjective:  Patient denies any further chest pain. Troponin on this area was mildly positive. EKG this a.m. showed nonspecific T-wave changes.  Objective:  Vital Signs in the last 24 hours: Temp:  [97.6 F (36.4 C)-98.2 F (36.8 C)] 98.2 F (36.8 C) (02/24 0511) Pulse Rate:  [66-91] 82 (02/24 0511) Resp:  [13-18] 16 (02/24 0511) BP: (109-170)/(72-102) 109/72 mmHg (02/24 0511) SpO2:  [96 %-100 %] 96 % (02/24 0511) Weight:  [93.759 kg (206 lb 11.2 oz)-93.895 kg (207 lb)] 93.759 kg (206 lb 11.2 oz) (02/23 2249)  Intake/Output from previous day:   Intake/Output from this shift:    Physical Exam: Neck: no adenopathy, no carotid bruit, no JVD and supple, symmetrical, trachea midline Lungs: clear to auscultation bilaterally Heart: regular rate and rhythm, S1, S2 normal and Soft systolic murmur noted no S3 gallop Abdomen: soft, non-tender; bowel sounds normal; no masses,  no organomegaly Extremities: extremities normal, atraumatic, no cyanosis or edema  Lab Results:  Recent Labs  02/23/14 1633 02/24/14 0005  WBC 9.3 7.5  HGB 13.7 12.6*  PLT 277 256    Recent Labs  02/23/14 1633 02/24/14 0005  NA 135 136  K 4.0 3.6  CL 101 102  CO2 28 30  GLUCOSE 179* 151*  BUN 14 12  CREATININE 0.93 0.96    Recent Labs  02/24/14 0734  TROPONINI 2.50*   Hepatic Function Panel  Recent Labs  02/24/14 0005  PROT 6.1  ALBUMIN 3.7  AST 32  ALT 33  ALKPHOS 62  BILITOT 0.9    Recent Labs  02/24/14 0005  CHOL 180   No results for input(s): PROTIME in the last 72 hours.  Imaging: Imaging results have been reviewed and Dg Chest Port 1 View  02/23/2014   CLINICAL DATA:  Left-sided chest pain with radiation into the left arm region.  EXAM: PORTABLE CHEST - 1 VIEW  COMPARISON:  August 09, 2012  FINDINGS: There is no edema or consolidation. Heart is upper normal in size with pulmonary vascularity within normal limits. No adenopathy. No bone lesions. No pneumothorax.  IMPRESSION:  No edema or consolidation.   Electronically Signed   By: William  Woodruff III M.D.   On: 02/23/2014 17:10   Ct Angio Chest Aorta W/cm &/or Wo/cm  02/23/2014   CLINICAL DATA:  Left-sided chest pain onset 30 min ago with radiation down the left arm. Diaphoresis.  EXAM: CT ANGIOGRAPHY CHEST WITH CONTRAST  TECHNIQUE: Multidetector CT imaging of the chest was performed using the standard protocol during bolus administration of intravenous contrast. Multiplanar CT image reconstructions and MIPs were obtained to evaluate the vascular anatomy.  CONTRAST:  100mL OMNIPAQUE IOHEXOL 350 MG/ML SOLN  COMPARISON:  Chest 02/23/2014  FINDINGS: Unenhanced images obtained of the chest demonstrate normal caliber thoracic aorta. No significant aortic or Coronary artery calcifications. No evidence of intramural hematoma.  Arterial phase contrast-enhanced images of the chest demonstrate normal caliber thoracic aorta. No aortic dissection or aneurysm. Great vessel origins are patent. Central pulmonary arteries are well opacified without evidence of significant pulmonary embolus.  Normal heart size. Esophagus is decompressed. No significant lymphadenopathy in the chest. Calcified lymph nodes present in the right hilum and right mediastinum consistent postinflammatory change.  Slight interstitial pattern to the peripheral lungs may represent fibrosis or atelectasis. No focal consolidation. No pleural effusions. No pneumothorax. Airways appear patent. No destructive bone lesions. Included portions of the upper abdominal organs demonstrate diffuse fatty infiltration of the liver. Subcentimeter left renal cyst.  Review   of the MIP images confirms the above findings.  IMPRESSION: No evidence of aneurysm or dissection of the thoracic aorta.   Electronically Signed   By: William  Stevens M.D.   On: 02/23/2014 19:56    Cardiac Studies:  Assessment/Plan:  Small non-Q-wave myocardial infarction. Hypertension Diabetes  mellitus Hypercholesteremia History of diverticulitis   plan Discussed with patient at length regarding mildly elevated troponin I and cardiac catheterization and possible PTCA stenting this risk and benefits are reviewed that time of stroke need for emergency CABG local vascular complications risk of restenosis etc. and consents for PCI.   [name] [name] N 02/24/2014, 9:49 AM    

## 2014-02-24 NOTE — Care Management Note (Addendum)
    Page 1 of 1   02/25/2014     11:22:33 AM CARE MANAGEMENT NOTE 02/25/2014  Patient:  [name] [name],[name] [name]   Account Number:  [id number]  Date Initiated:  02/24/2014  Documentation initiated by:  GRAVES-BIGELOW,Denman Pichardo  Subjective/Objective Assessment:   Pt admitted for recurrent chest pain. Plan for cath possible PCI.     Action/Plan:   CM will monitor for disposition needs.   Anticipated DC Date:  02/25/2014   Anticipated DC Plan:  HOME/SELF CARE      DC Planning Services  CM consult      Choice offered to / List presented to:             Status of service:  Completed, signed off Medicare Important Message given?  NO (If response is "NO", the following Medicare IM given date fields will be blank) Date Medicare IM given:   Medicare IM given by:   Date Additional Medicare IM given:   Additional Medicare IM given by:    Discharge Disposition:  HOME/SELF CARE  Per UR Regulation:  Reviewed for med. necessity/level of care/duration of stay  If discussed at Long Length of Stay Meetings, dates discussed:    Comments:  02-25-14 1111 Tomi BambergerBrenda Graves-Bigelow, RN,BSN [phone]  [facility] at [phone]. Talked to CSR Cala BradfordKimberly (Ref Cala Bradford- Kimberly H - 02/25/2014 - 10:44am). [place] is covered. No Prior Authorization required. Retail Pharmacy Co-Payment would be $315.90. CM did provide pt with 30 day free card. Hopefully co pay card will assist with lowering co pay to $18.00. [facility] [address] [facility]. CM did call [facility] on [address] and medication is available. CM did make RN aware as well. No further needs from CM at this time.  02-25-14 [address] Graves-Bigelow, [place] [phone] Benefits check in process and will make pt aware of cost once completed. CM will provide pt with 30 day free and co pay card. Pt will need a Rx for 30 day free. No further needs from CM at this time.

## 2014-02-25 ENCOUNTER — Encounter (HOSPITAL_COMMUNITY): Payer: Self-pay | Admitting: Cardiology

## 2014-02-25 LAB — CBC
HCT: 39 % (ref 39.0–52.0)
Hemoglobin: 13.2 g/dL (ref 13.0–17.0)
MCH: 28.8 pg (ref 26.0–34.0)
MCHC: 33.8 g/dL (ref 30.0–36.0)
MCV: 85.2 fL (ref 78.0–100.0)
Platelets: 261 10*3/uL (ref 150–400)
RBC: 4.58 MIL/uL (ref 4.22–5.81)
RDW: 13.1 % (ref 11.5–15.5)
WBC: 8.6 10*3/uL (ref 4.0–10.5)

## 2014-02-25 LAB — GLUCOSE, CAPILLARY
Glucose-Capillary: 118 mg/dL — ABNORMAL HIGH (ref 70–99)
Glucose-Capillary: 133 mg/dL — ABNORMAL HIGH (ref 70–99)

## 2014-02-25 LAB — TROPONIN I
Troponin I: 6.89 ng/mL (ref <0.031)
Troponin I: 4.07 ng/mL (ref <0.031)
Troponin I: 3.24 ng/mL (ref <0.031)

## 2014-02-25 LAB — HEMOGLOBIN A1C
Hgb A1c MFr Bld: 7.1 % — ABNORMAL HIGH (ref 4.8–5.6)
Mean Plasma Glucose: 157 mg/dL

## 2014-02-25 MED ORDER — TICAGRELOR 90 MG PO TABS: 90.0000 mg | ORAL_TABLET | Freq: Two times a day (BID) | ORAL | Status: AC

## 2014-02-25 MED ORDER — NITROGLYCERIN 0.4 MG SL SUBL: 0.4000 mg | SUBLINGUAL_TABLET | SUBLINGUAL | Status: AC | PRN

## 2014-02-25 MED ORDER — METOPROLOL SUCCINATE ER 50 MG PO TB24
50.0000 mg | ORAL_TABLET | Freq: Every day | ORAL | Status: AC
Start: 2014-02-25 — End: ?

## 2014-02-25 MED ORDER — ATORVASTATIN CALCIUM 40 MG PO TABS: 40.0000 mg | ORAL_TABLET | Freq: Every day | ORAL | Status: AC

## 2014-02-25 MED ORDER — RAMIPRIL 10 MG PO CAPS: 10.0000 mg | ORAL_CAPSULE | Freq: Every day | ORAL | Status: AC

## 2014-02-25 NOTE — Discharge Summary (Signed)
Discharge summary dictated on 02/25/2014 dictation number is [id number]

## 2014-02-25 NOTE — Discharge Instructions (Signed)
Coronary Angiogram °A coronary angiogram, also called coronary angiography, is an X-ray procedure used to look at the arteries in the heart. In this procedure, a dye (contrast dye) is injected through a long, hollow tube (catheter). The catheter is about the size of a piece of cooked spaghetti and is inserted through your groin, wrist, or arm. The dye is injected into each artery, and X-rays are then taken to show if there is a blockage in the arteries of your heart. °LET YOUR HEALTH CARE PROVIDER KNOW ABOUT: °· Any allergies you have, including allergies to shellfish or contrast dye.   °· All medicines you are taking, including vitamins, herbs, eye drops, creams, and over-the-counter medicines.   °· Previous problems you or members of your family have had with the use of anesthetics.   °· Any blood disorders you have.   °· Previous surgeries you have had. °· History of kidney problems or failure.   °· Other medical conditions you have. °RISKS AND COMPLICATIONS  °Generally, a coronary angiogram is a safe procedure. However, problems can occur and include: °· Allergic reaction to the dye. °· Bleeding from the access site or other locations. °· Kidney injury, especially in people with impaired kidney function.  °· Stroke (rare). °· Heart attack (rare). °BEFORE THE PROCEDURE  °· Do not eat or drink anything after midnight the night before the procedure or as directed by your health care provider.   °· Ask your health care provider about changing or stopping your regular medicines. This is especially important if you are taking diabetes medicines or blood thinners. °PROCEDURE °· You may be given a medicine to help you relax (sedative) before the procedure. This medicine is given through an intravenous (IV) access tube that is inserted into one of your veins.   °· The area where the catheter will be inserted will be washed and shaved. This is usually done in the groin but may be done in the fold of your arm (near your  elbow) or in the wrist.    °· A medicine will be given to numb the area where the catheter will be inserted (local anesthetic).   °· The health care provider will insert the catheter into an artery. The catheter will be guided by using a special type of X-ray (fluoroscopy) of the blood vessel being examined.   °· A special dye will then be injected into the catheter, and X-rays will be taken. The dye will help to show where any narrowing or blockages are located in the heart arteries.   °AFTER THE PROCEDURE  °· If the procedure is done through the leg, you will be kept in bed lying flat for several hours. You will be instructed to not bend or cross your legs. °· The insertion site will be checked frequently.   °· The pulse in your feet or wrist will be checked frequently.   °· Additional blood tests, X-rays, and an electrocardiogram may be done.   °Document Released: 06/24/2002 Document Revised: 05/04/2013 Document Reviewed: 05/12/2012 °ExitCare® Patient Information ©2015 [employer]. This information is not intended to replace advice given to you by your health care provider. Make sure you discuss any questions you have with your health care provider. °Acute Coronary Syndrome °Acute coronary syndrome (ACS) is an urgent problem in which the blood and oxygen supply to the heart is critically deficient. ACS requires hospitalization because one or more coronary arteries may be blocked. °ACS represents a range of conditions including: °· Previous angina that is now unstable, lasts longer, happens at rest, or is more intense. °· A   heart attack, with heart muscle cell injury and death. °There are three vital coronary arteries that supply the heart muscle with blood and oxygen so that it can pump blood effectively. If blockages to these arteries develop, blood flow to the heart muscle is reduced. If the heart does not get enough blood, angina may occur as the first warning sign. °SYMPTOMS  °· The most common signs of  angina include: °· Tightness or squeezing in the chest. °· Feeling of heaviness on the chest. °· Discomfort in the arms, neck, back, or jaw. °· Shortness of breath and nausea. °· Cold, wet skin. °· Angina is usually brought on by physical effort or excitement which increase the oxygen needs of the heart. These states increase the blood flow needs of the heart beyond what can be delivered. °· Other symptoms that are not as common include: °· Fatigue °· Unexplained feelings of nervousness or anxiety °· Weakness °· Diarrhea °· Sometimes, you may not have noticed any symptoms at all but still suffered a cardiac injury. °TREATMENT  °· Medicines to help discomfort may include nitroglycerin (nitro) in the form of tablets or a spray for rapid relief, or longer-acting forms such as cream, patches, or capsules. (Be aware that there are many side effects and possible interactions with other drugs). °· Other medicines may be used to help the heart pump better. °· Procedures to open blocked arteries including angioplasty or stent placement to keep the arteries open. °· Open heart surgery may be needed when there are many blockages or they are in critical locations that are best treated with surgery. °HOME CARE INSTRUCTIONS  °· Do not use any tobacco products including cigarettes, chewing tobacco, or electronic cigarettes. °· Take one baby or adult aspirin daily, if your health care provider advises. This helps reduce the risk of a heart attack. °· It is very important that you follow the angina treatment prescribed by your health care provider. Make arrangements for proper follow-up care. °· Eat a heart healthy diet with salt and fat restrictions as advised. °· Regular exercise is good for you as long as it does not cause discomfort. Do not begin any new type of exercise until you check with your health care provider. °· If you are overweight, you should lose weight. °· Try to maintain normal blood lipid levels. °· Keep your  blood pressure under control as recommended by your health care provider. °· You should tell your health care provider right away about any increase in the severity or frequency of your chest discomfort or angina attacks. When you have angina, you should stop what you are doing and sit down. This may bring relief in 3 to 5 minutes. If your health care provider has prescribed nitro, take it as directed. °· If your health care provider has given you a follow-up appointment, it is very important to keep that appointment. Not keeping the appointment could result in a chronic or permanent injury, pain, and disability. If there is any problem keeping the appointment, you must call back to this facility for assistance. °SEEK IMMEDIATE MEDICAL CARE IF:  °· You develop nausea, vomiting, or shortness of breath. °· You feel faint, lightheaded, or pass out. °· Your chest discomfort gets worse. °· You are sweating or experience sudden profound fatigue. °· You do not get relief of your chest pain after 3 doses of nitro. °· Your discomfort lasts longer than 15 minutes. °MAKE SURE YOU:  °· Understand these instructions. °· Will watch your   condition. °· Will get help right away if you are not doing well or get worse. °· Take all medicines as directed by your health care provider. °Document Released: 12/18/2004 Document Revised: 12/23/2012 Document Reviewed: 04/21/2013 °ExitCare® Patient Information ©2015 [employer]. This information is not intended to replace advice given to you by your health care provider. Make sure you discuss any questions you have with your health care provider. ° °

## 2014-02-25 NOTE — Progress Notes (Signed)
Pt ambulated twice around circle without any assistance. Laid back in bed, removed pressure dressing and replaced with a band aid. Will continue to monitor

## 2014-02-25 NOTE — Progress Notes (Signed)
Pt is ready for DC home accompanied by family. Pt reports he understands all DC instructions, follow up appointments, and medications.   Torie Warden/rangerhirk RN

## 2014-02-25 NOTE — Progress Notes (Signed)
Pt's troponin 7.13. Pt resting in bed CP free. Md on call made aware. No new orders received. Will cont to monitor pt.

## 2014-02-25 NOTE — Cardiovascular Report (Signed)
NAMJackquline Bosch:  [name], [name]               ACCOUNT NO.:  [id number]  MEDICAL RECORD NO.:  [id number]  LOCATION:  3W18C                        FACILITY:  [facility]  PHYSICIAN:  Eduardo OsierMohan N. Sharyn LullHarwani, M.D. DATE OF BIRTH:  09-21-1970  DATE OF PROCEDURE:  02/24/2014 DATE OF DISCHARGE:                           CARDIAC CATHETERIZATION   PROCEDURE:  Left cardiac catheterization with selective left and right coronary angiography, left ventriculography via right groin using Judkins technique.  INDICATION FOR THE PROCEDURE:  [name] [name] is 44 year old Asian male with past medical history significant for hypertension, type 2 diabetes mellitus, hypercholesteremia.  He came to the ER complaining of left- sided and retrosternal chest pain described as pressure radiating to left arm while in the meeting early yesterday morning associated with diaphoresis.  States chest pain was 10/10 as if someone is sitting on the chest lasting approximately 20-25 minutes.  Denies any palpitation, lightheadedness, or syncope.  Again had similar chest pain yesterday afternoon and in the evening radiating to the left arm associated with numbness, so decided to come to ED for further evaluation.  EKG done in the ER showed no acute ischemic changes.  First 2 sets of troponin-I were negative point of care.  The patient also had CT angio of the chest which showed no evidence of dissection.  The patient denies any shortness of breath.  Denies palpitation, lightheadedness, or syncope. Denies relation of chest pain to food, breathing, or movement. Subsequently, the patient had troponin I from the lab which was mildly positive troponin I of 2.50.  Repeat EKG done earlier this morning showed normal sinus rhythm with nonspecific T-wave changes.  Due to typical anginal chest pain, mildly elevated troponin I and multiple risk factors, discussed with the patient regarding left cath, possible PTCA and stenting, its risks and benefits, i.e.,  death, MI, stroke, need for emergency CABG, local vascular complications, etc. and consented for PCI.  DESCRIPTION OF PROCEDURE:  After obtaining the informed consent, the patient was brought to the cath lab and was placed on fluoroscopy table. Right groin was prepped and draped in usual fashion.  A 1% Xylocaine was used for local anesthesia in the right groin.  With the help of thin wall needle, a 5-French arterial sheath was placed.  The sheath was aspirated and flushed.  Next, 5-French left Judkins catheter was advanced over the wire under fluoroscopic guidance up to the ascending aorta.  Wire was pulled out.  The catheter was aspirated and connected to the Manifold.  Catheter was further advanced and engaged into left coronary ostium.  Multiple views of the left system were taken.  Next, catheter was disengaged and was pulled out over the wire and was replaced with 5-French right Judkins catheter, which was advanced over the wire under fluoroscopic guidance up to the ascending aorta.  Wire was pulled out.  The catheter was aspirated and connected to the Manifold.  Catheter was further advanced and engaged into right coronary ostium.  Multiple views of the right system were taken.  Next, catheter was disengaged and was pulled out over the wire and was replaced with 5- French pigtail catheter, which was advanced over the wire under fluoroscopic guidance up to  the ascending aorta.  Catheter was further advanced across the aortic valve into the LV.  LV pressures were recorded.  Next, LV graft was done in 30-degree RAO position.  Post- angiographic pressures were recorded from LV and then pullback pressures were recorded from the aorta.  There was no gradient across the aortic valve.  His LVEDP was 22 mmHg.  Next, the pigtail catheter was pulled out over the wire.  Sheaths were aspirated and flushed.  FINDINGS:  LV showed mild anterolateral and apical wall hypokinesia, EF of 50-55%.   Left main was patent.  LAD was patent in proximal portion and in mid portion was diffusely diseased and had 90-95% apical stenosis distally.  Beyond the stenosis, the vessel is less than 1.5 mm and supplying very small area of myocardium.  Diagonal 1 was small, which was patent.  Left circumflex has 15-20% mid stenosis.  OM1 and OM2 were very small, which were patent.  OM3 and 4 were small, which were patent. RCA was patent.  PDA and PLV branches were patent.  The patient tolerated the procedure well.  There were no complications.  PLAN:  To maximize antianginal medications and treat medically.  If the patient continues to have recurrent chest pain, then may consider high- risk PCI to apical LAD in future.     Eduardo Osier. Sharyn Lull, M.D.     MNH/MEDQ  D:  02/24/2014  T:  02/25/2014  Job:  [id number]

## 2014-02-26 NOTE — Discharge Summary (Signed)
NAMECLEM, WISENBAKER NO.:  [id number]  MEDICAL RECORD NO.:  [id number]  LOCATION:  [id number]                        FACILITY:  [facility]  PHYSICIAN:  Eduardo Osier. Sharyn Lull, M.D. DATE OF BIRTH:  13-Nov-1970  DATE OF ADMISSION:  02/23/2014 DATE OF DISCHARGE:  02/25/2014                              DISCHARGE SUMMARY   ADMITTING DIAGNOSES: 1. Recurrent chest pain with features worrisome for angina rule out     myocardial infarction. 2. Hypertension. 3. Diabetes mellitus. 4. Hypercholesteremia. 5. History of diverticulitis.  DISCHARGE DIAGNOSES: 1. Status post small non-Q-wave myocardial infarction status post left     cardiac catheterization. 2. Hypertension. 3. Diabetes mellitus. 4. Hypercholesteremia. 5. History of diverticulitis in the past.  DISCHARGE HOME MEDICATIONS: 1. Atorvastatin 40 mg 1 tablet daily. 2. Metoprolol succinate 50 mg 1 tablet daily. 3. Ramipril 10 mg 1 capsule daily. 4. Brilinta 90 mg 1 tablet twice daily. 5. Aspirin 81 mg 1 tablet daily. 6. Gemfibrozil 600 mg daily. 7. Multivitamin 1 tablet daily.  DIET:  Low salt, low cholesterol 1800 calories ADA diet.  The patient has been advised to monitor blood pressure and blood sugar daily and chart.  Follow up with me in 1 week.  Post cardiac cath instructions have been given.  CONDITION AT DISCHARGE:  Stable.  BRIEF HISTORY AND HOSPITAL COURSE:  Mr. [name] [name] is a 44 year old Asian male with past medical history significant for hypertension, type 2 diabetes mellitus, hypercholesteremia who came to the ER complaining of left-sided and retrosternal chest pain described as pressure radiating to the left arm while in a meeting early in the morning associated with diaphoresis.  States chest pain was grade 10/10 as if someone was sitting on his chest.  Pain lasted approximately 20-25 minutes. Denies any palpitation, lightheadedness, or syncope.  Did not seek any medical attention.  Again, had similar  chest pain in the afternoon and evening radiating to the left arm associated with numbness, so decided to come to ED for further evaluation.  EKG done in the ER showed no acute ischemic changes.  Two sets of troponin I in the ED were negative.  The patient also had CT angio of the chest which showed no evidence of dissection or pulmonary embolism.  The patient denies any shortness of breath.  Denies PND, orthopnea, or leg swelling.  Denies cough, fever, or chills.  Denies relation of chest pain to food, breathing, or movement.  PHYSICAL EXAMINATION:  GENERAL:  He was alert, awake, oriented x3. VITAL SIGNS:  Blood pressure was 121/81, pulse was 75, he was afebrile. HEENT:  Conjunctivae were pink. NECK:  Supple.  No JVD.  No bruit. LUNGS:  Clear to auscultation without rhonchi or rales. CARDIOVASCULAR:  S1, S2 was normal.  There was soft systolic murmur.  No S3, gallop. ABDOMEN:  Soft.  Bowel sounds were present.  Nontender. EXTREMITIES:  There was no clubbing, cyanosis, or edema.  LABORATORY DATA:  His 3 sets of troponin I point of care were 0.01, 0.02, 0.02.  By lab, troponin I was 2.50, next set was 7.13, 6.89, 4.07, this morning was 3.24 which is trending down.  His sodium was 136, potassium 3.6, glucose 157,  BUN 12, creatinine 0.96.  Cholesterol was 180, triglycerides were 269, HDL was low 32, LDL 94.  Hemoglobin was 12.6, hematocrit 36.7, white count of 7.5.  EKG done in the ER showed normal sinus rhythm with no acute ischemic changes noted.  Repeat EKG done yesterday showed normal sinus rhythm with nonspecific T-wave changes in lateral leads.  EKG this morning showed normal sinus rhythm with no acute ischemic changes.  BRIEF HOSPITAL COURSE:  The patient was admitted to telemetry unit.  The patient initially was scheduled for stress Myoview, but due to elevated cardiac enzymes and typical anginal chest pain, as the patient ruled in for a very small non-Q-wave MI, the patient  subsequently underwent left cardiac cath with selective left and right coronary angiography as per procedure report.  The patient had good LV systolic function, had epical LAD stenosis, the vessel distally was less than 1.5 mm and supplying very small area of myocardium which was felt not the suitable vessel for percutaneous intervention.  The patient did not have any episodes of further chest pain during the hospital stay.  His groin is stable with no evidence of hematoma or bruit.  The patient is ambulating in room without any problems.  The patient will be discharged home on above medications.  The patient has been counseled extensively regarding lifestyle changes, diet, exercise, and compliance with medications.  The patient will be scheduled for phase 2 cardiac rehab as outpatient.  If he continues to have recurrent chest pain, may consider PCI to apical LAD in future.     Eduardo OsierMohan N. Sharyn LullHarwani, M.D.     MNH/MEDQ  D:  02/25/2014  T:  02/26/2014  Job:  [id number]

## 2016-09-18 ENCOUNTER — Emergency Department (HOSPITAL_COMMUNITY): Payer: BLUE CROSS/BLUE SHIELD

## 2016-09-18 ENCOUNTER — Encounter (HOSPITAL_COMMUNITY): Payer: Self-pay | Admitting: *Deleted

## 2016-09-18 ENCOUNTER — Emergency Department (HOSPITAL_COMMUNITY)
Admission: EM | Admit: 2016-09-18 | Discharge: 2016-09-18 | Disposition: A | Discharge status: Home / Self Care | Payer: BLUE CROSS/BLUE SHIELD | Attending: Emergency Medicine | Admitting: Emergency Medicine

## 2016-09-18 DIAGNOSIS — R112 Nausea with vomiting, unspecified: Secondary | ICD-10-CM | POA: Insufficient documentation

## 2016-09-18 DIAGNOSIS — R197 Diarrhea, unspecified: Secondary | ICD-10-CM

## 2016-09-18 DIAGNOSIS — R1011 Right upper quadrant pain: Secondary | ICD-10-CM | POA: Insufficient documentation

## 2016-09-18 DIAGNOSIS — Z79899 Other long term (current) drug therapy: Secondary | ICD-10-CM | POA: Insufficient documentation

## 2016-09-18 DIAGNOSIS — I10 Essential (primary) hypertension: Secondary | ICD-10-CM | POA: Diagnosis not present

## 2016-09-18 DIAGNOSIS — R1084 Generalized abdominal pain: Secondary | ICD-10-CM

## 2016-09-18 LAB — CBC
HEMATOCRIT: 42.9 % (ref 39.0–52.0)
HEMOGLOBIN: 15 g/dL (ref 13.0–17.0)
MCH: 29.3 pg (ref 26.0–34.0)
MCHC: 35 g/dL (ref 30.0–36.0)
MCV: 83.8 fL (ref 78.0–100.0)
Platelets: 226 10*3/uL (ref 150–400)
RBC: 5.12 MIL/uL (ref 4.22–5.81)
RDW: 12.7 % (ref 11.5–15.5)
WBC: 4.5 10*3/uL (ref 4.0–10.5)

## 2016-09-18 LAB — COMPREHENSIVE METABOLIC PANEL
ALT: 44 U/L (ref 17–63)
ANION GAP: 11 (ref 5–15)
AST: 31 U/L (ref 15–41)
Albumin: 4.7 g/dL (ref 3.5–5.0)
Alkaline Phosphatase: 110 U/L (ref 38–126)
BILIRUBIN TOTAL: 1 mg/dL (ref 0.3–1.2)
BUN: 20 mg/dL (ref 6–20)
CO2: 25 mmol/L (ref 22–32)
Calcium: 9.4 mg/dL (ref 8.9–10.3)
Chloride: 98 mmol/L — ABNORMAL LOW (ref 101–111)
Creatinine, Ser: 1.11 mg/dL (ref 0.61–1.24)
Glucose, Bld: 224 mg/dL — ABNORMAL HIGH (ref 65–99)
POTASSIUM: 3.9 mmol/L (ref 3.5–5.1)
Sodium: 134 mmol/L — ABNORMAL LOW (ref 135–145)
TOTAL PROTEIN: 8.2 g/dL — AB (ref 6.5–8.1)

## 2016-09-18 LAB — LIPASE, BLOOD: LIPASE: 69 U/L — AB (ref 11–51)

## 2016-09-18 MED ORDER — PANTOPRAZOLE SODIUM 40 MG PO TBEC
40.0000 mg | DELAYED_RELEASE_TABLET | Freq: Every day | ORAL | Status: DC
  Administered 2016-09-18: 40 mg via ORAL
  Filled 2016-09-18: qty 1

## 2016-09-18 MED ORDER — ONDANSETRON HCL 4 MG/2ML IJ SOLN
4.0000 mg | Freq: Once | INTRAMUSCULAR | Status: AC
  Administered 2016-09-18: 4 mg via INTRAVENOUS
  Filled 2016-09-18: qty 2

## 2016-09-18 MED ORDER — ONDANSETRON 8 MG PO TBDP: 8.0000 mg | ORAL_TABLET | Freq: Three times a day (TID) | ORAL | 0 refills | Status: AC | PRN

## 2016-09-18 MED ORDER — LACTATED RINGERS IV BOLUS (SEPSIS)
1000.0000 mL | Freq: Once | INTRAVENOUS | Status: AC
  Administered 2016-09-18: 1000 mL via INTRAVENOUS

## 2016-09-18 MED ORDER — ONDANSETRON HCL 4 MG PO TABS
4.0000 mg | ORAL_TABLET | Freq: Once | ORAL | Status: AC
  Administered 2016-09-18: 4 mg via ORAL
  Filled 2016-09-18: qty 1

## 2016-09-18 MED ORDER — OMEPRAZOLE 20 MG PO CPDR: 20.0000 mg | DELAYED_RELEASE_CAPSULE | Freq: Every day | ORAL | 0 refills | Status: AC

## 2016-09-18 MED ORDER — HYDROMORPHONE HCL 1 MG/ML IJ SOLN
1.0000 mg | Freq: Once | INTRAMUSCULAR | Status: AC
  Administered 2016-09-18: 1 mg via INTRAVENOUS
  Filled 2016-09-18: qty 1

## 2016-09-18 NOTE — ED Triage Notes (Signed)
EMS reports pt was walking his land in [place] around 9 am. Saw a mushroom and ate it, went to office and started vomiting around 11 am, friend called [phone]. Pt vague in answering questions.  ODT Zofran given. 130/90-75-CBG 210

## 2016-09-18 NOTE — ED Notes (Signed)
Bed: WA07 Expected date:  Expected time:  Means of arrival:  Comments: EMS- wild mushroom ingestion

## 2016-09-18 NOTE — ED Notes (Signed)
Pt urinated and had diarrhea in bed, pt claened

## 2016-09-18 NOTE — ED Notes (Signed)
Patient unable to give clean urine sample due to loose stools

## 2016-09-18 NOTE — ED Provider Notes (Addendum)
[facility]-EMERGENCY DEPT Provider Note   CSN: [id number] Arrival date & time: 09/18/16  1216     History   Chief Complaint Chief Complaint  Patient presents with  . Abdominal Pain    HPI [name] [name] is a 46 y.o. male.  HPI Pt with hx of DM and CAD comes in with cc of abd pain, nausea and emesis. Pt reports that around 11 he started having sudden onset abd pain, nausea, emesis and diarrhea. Pt had about 5+ episodes of emesis and loose BM. No blood. Pt's abd pain is in the upper quadrants and is non radiating. Pt has no hx of similar pain. Pt denies any chest pain, dib. Pt doesn't recall eating any foods that were suspicious, but he admits to eating a wild mushroom at 9 am.   Past Medical History:  Diagnosis Date  . Chest pain 02/2014  . Diabetes mellitus without complication (HCC)    type 2  . Diverticulitis   . Headache   . Hypertension     Patient Active Problem List   Diagnosis Date Noted  . NSTEMI (non-ST elevated myocardial infarction) (HCC) 02/24/2014  . Recurrent chest pain 02/23/2014    Past Surgical History:  Procedure Laterality Date  . APPENDECTOMY    . LEFT HEART CATHETERIZATION WITH CORONARY ANGIOGRAM N/A 02/24/2014   Procedure: LEFT HEART CATHETERIZATION WITH CORONARY ANGIOGRAM;  Surgeon: Robynn Pane, MD;  Location: [facility] CATH LAB;  Service: Cardiovascular;  Laterality: N/A;       Home Medications    Prior to Admission medications   Medication Sig Start Date End Date Taking? Authorizing Provider  aspirin EC 81 MG EC tablet Take 1 tablet (81 mg total) by mouth daily. Patient not taking: Reported on 02/23/2014 08/10/12   Rinaldo Cloud, MD  atorvastatin (LIPITOR) 40 MG tablet Take 1 tablet (40 mg total) by mouth daily at 6 PM. 02/25/14   Rinaldo Cloud, MD  gemfibrozil (LOPID) 600 MG tablet Take 600 mg by mouth daily.     [provider]  metoprolol succinate (TOPROL XL) 50 MG 24 hr tablet Take 1 tablet (50 mg total) by mouth daily. Take with or  immediately following a meal. 02/25/14   Rinaldo Cloud, MD  Multiple Vitamin (MULTIVITAMINS PO) Take 1 tablet by mouth daily.    [provider]  nitroGLYCERIN (NITROSTAT) 0.4 MG SL tablet Place 1 tablet (0.4 mg total) under the tongue every 5 (five) minutes x 3 doses as needed for chest pain. 02/25/14   Rinaldo Cloud, MD  ramipril (ALTACE) 10 MG capsule Take 1 capsule (10 mg total) by mouth daily. 02/25/14   Rinaldo Cloud, MD  ticagrelor (BRILINTA) 90 MG TABS tablet Take 1 tablet (90 mg total) by mouth 2 (two) times daily. 02/25/14   Rinaldo Cloud, MD    Family History No family history on file.  Social History Social History  Substance Use Topics  . Smoking status: Never Smoker  . Smokeless tobacco: Never Used  . Alcohol use No     Allergies   Patient has no known allergies.   Review of Systems Review of Systems  Constitutional: Positive for activity change and fatigue.  Respiratory: Negative for shortness of breath.   Cardiovascular: Negative for chest pain.  Gastrointestinal: Positive for abdominal pain, diarrhea, nausea and vomiting.  Neurological: Positive for weakness.  All other systems reviewed and are negative.    Physical Exam Updated Vital Signs BP (!) 135/93   Pulse (!) 107   Resp 20  Ht  (1.727 m)   Wt 95.3 kg (210 lb)   SpO2 93%   BMI 31.93 kg/m   Physical Exam  Constitutional: He is oriented to person, place, and time. He appears well-developed.  HENT:  Head: Normocephalic and atraumatic.  Eyes: Pupils are equal, round, and reactive to light. Conjunctivae and EOM are normal.  Neck: Normal range of motion. Neck supple.  Cardiovascular: Normal rate and regular rhythm.   Pulmonary/Chest: Effort normal and breath sounds normal.  Abdominal: Soft. Bowel sounds are normal. He exhibits no distension and no mass. There is tenderness. There is no rebound and no guarding.  Upper quadrant tenderness  Musculoskeletal: He exhibits no deformity.    Neurological: He is alert and oriented to person, place, and time.  Skin: Skin is warm.  Nursing note and vitals reviewed.    ED Treatments / Results  Labs (all labs ordered are listed, but only abnormal results are displayed) Labs Reviewed  LIPASE, BLOOD - Abnormal; Notable for the following:       Result Value   Lipase 69 (*)    All other components within normal limits  COMPREHENSIVE METABOLIC PANEL - Abnormal; Notable for the following:    Sodium 134 (*)    Chloride 98 (*)    Glucose, Bld 224 (*)    Total Protein 8.2 (*)    All other components within normal limits  CBC  URINALYSIS, ROUTINE W REFLEX MICROSCOPIC    EKG  EKG Interpretation  Date/Time:  Tuesday September 18 2016 12:38:38 EDT Ventricular Rate:  83 PR Interval:    QRS Duration: 90 QT Interval:  370 QTC Calculation: 435 R Axis:   122 Text Interpretation:  Sinus rhythm Probable right ventricular hypertrophy Borderline T abnormalities, diffuse leads Borderline ST elevation, lateral leads No acute changes [id number] is not new No significant change since last tracing Confirmed by Derwood Kaplan ([id number]) on 09/18/2016 2:39:55 PM        EKG Interpretation  Date/Time:  Tuesday September 18 2016 17:09:19 EDT Ventricular Rate:  113 PR Interval:    QRS Duration: 84 QT Interval:  329 QTC Calculation: 452 R Axis:   136 Text Interpretation:  Sinus tachycardia Right axis deviation Low voltage, precordial leads Borderline repolarization abnormality No acute changes No significant change since last tracing Confirmed by Derwood Kaplan [phone]) on 09/18/2016 5:21:53 PM        Radiology No results found.  Procedures Procedures (including critical care time)  Medications Ordered in ED Medications  lactated ringers bolus 1,000 mL (not administered)  HYDROmorphone (DILAUDID) injection 1 mg (1 mg Intravenous Given 09/18/16 1352)  ondansetron (ZOFRAN) injection 4 mg (4 mg Intravenous Given 09/18/16 1353)      Initial Impression / Assessment and Plan / ED Course  I have reviewed the triage vital signs and the nursing notes.  Pertinent labs & imaging results that were available during my care of the patient were reviewed by me and considered in my medical decision making (see chart for details).  Clinical Course as of Sep 19 1634  Tue Sep 18, 2016  1529 Pain improved.  [place] ordered.  [AN]  1629 I reviewed the images and I dont think there is any gallstones. Results pre-emptively discussed. Pt's abd pain has now resolved. Still feels weak and nauseated. Labs look reassuring. Lipase is slightly elevated. Could have PUD if the [place] is neg. Strict ER return precautions have been discussed, and patient is agreeing with the plan and is comfortable with  the workup done and the recommendations from the ER. PCP f/u encouraged. US Abdomen Limited [AN]    Clinical Course User Index [AN] Derwood Kaplan, MD   Pt comes in with cc of abd pain, nausea, emesis, diarrhea. Symptoms came all of a sudden and are severe. Pt ate a wild mushroom prior to the symptoms starting. Pt has ACS hx, but the current pain is in the abd and there is no chest pain or dib.  Screening ekg is not showing any acute changes. We will get basic labs. Gastroenteritis, food poisoning/ toxin mediated process in the ddx.   Final Clinical Impressions(s) / ED Diagnoses   Final diagnoses:  Nausea vomiting and diarrhea  Generalized abdominal pain    New Prescriptions New Prescriptions   No medications on file     Derwood Kaplan, MD 09/18/16 1637    Derwood Kaplan, MD 09/18/16 [phone]

## 2016-09-18 NOTE — Discharge Instructions (Signed)
We saw you in the ER for the nausea, vomiting and diarrhea. All the results in the ER are normal, labs and imaging.  The workup in the ER is not complete, and is limited to screening for life threatening and emergent conditions only, so please see a primary care doctor for further evaluation.  Please return to the ER if your symptoms worsen; you have increased pain, fevers, chills, inability to keep any medications down, chest pain. Otherwise see the outpatient doctor as requested.

## 2016-09-22 NOTE — ED Provider Notes (Signed)
Pt DC'd by Dr. Rhunette Croft. Family with questions for provider. Pt examined and labs reviewed. Normal neuro, without loss of strength or DTRs to extremities. Global weakness per pt, but ambulatory and non focal. Reassured. Return with failure to improve/worsening,.   Rolland Porter, MD 09/22/16 [phone]

## 2017-07-03 ENCOUNTER — Other Ambulatory Visit: Payer: Self-pay | Admitting: Cardiology

## 2017-07-03 DIAGNOSIS — R079 Chest pain, unspecified: Secondary | ICD-10-CM

## 2017-07-10 ENCOUNTER — Encounter (HOSPITAL_COMMUNITY)
Admission: RE | Admit: 2017-07-10 | Discharge: 2017-07-10 | Disposition: A | Discharge status: Home / Self Care | Payer: BLUE CROSS/BLUE SHIELD | Source: Ambulatory Visit | Attending: Cardiology | Admitting: Cardiology

## 2017-07-10 DIAGNOSIS — R079 Chest pain, unspecified: Secondary | ICD-10-CM | POA: Diagnosis present

## 2017-07-10 MED ORDER — REGADENOSON 0.4 MG/5ML IV SOLN
0.4000 mg | Freq: Once | INTRAVENOUS | Status: AC
  Administered 2017-07-10: 0.4 mg via INTRAVENOUS

## 2017-07-10 MED ORDER — TECHNETIUM TC 99M TETROFOSMIN IV KIT
10.0000 | PACK | Freq: Once | INTRAVENOUS | Status: AC | PRN
  Administered 2017-07-10: 10 via INTRAVENOUS

## 2017-07-10 MED ORDER — TECHNETIUM TC 99M TETROFOSMIN IV KIT
30.0000 | PACK | Freq: Once | INTRAVENOUS | Status: AC | PRN
  Administered 2017-07-10: 30 via INTRAVENOUS

## 2017-07-10 MED ORDER — REGADENOSON 0.4 MG/5ML IV SOLN
INTRAVENOUS | Status: AC
  Administered 2017-07-10: 0.4 mg via INTRAVENOUS
  Filled 2017-07-10: qty 5

## 2021-04-18 DIAGNOSIS — E119 Type 2 diabetes mellitus without complications: Secondary | ICD-10-CM | POA: Diagnosis not present

## 2021-04-18 DIAGNOSIS — I10 Essential (primary) hypertension: Secondary | ICD-10-CM | POA: Diagnosis not present

## 2021-04-18 DIAGNOSIS — E785 Hyperlipidemia, unspecified: Secondary | ICD-10-CM | POA: Diagnosis not present

## 2021-04-18 DIAGNOSIS — I25118 Atherosclerotic heart disease of native coronary artery with other forms of angina pectoris: Secondary | ICD-10-CM | POA: Diagnosis not present

## 2021-06-09 DIAGNOSIS — R194 Change in bowel habit: Secondary | ICD-10-CM | POA: Diagnosis not present

## 2021-06-09 DIAGNOSIS — I214 Non-ST elevation (NSTEMI) myocardial infarction: Secondary | ICD-10-CM | POA: Diagnosis not present

## 2021-06-09 DIAGNOSIS — R143 Flatulence: Secondary | ICD-10-CM | POA: Diagnosis not present

## 2021-06-09 DIAGNOSIS — K921 Melena: Secondary | ICD-10-CM | POA: Diagnosis not present

## 2021-07-31 DIAGNOSIS — R194 Change in bowel habit: Secondary | ICD-10-CM | POA: Diagnosis not present

## 2021-07-31 DIAGNOSIS — K648 Other hemorrhoids: Secondary | ICD-10-CM | POA: Diagnosis not present

## 2021-09-07 DIAGNOSIS — M25831 Other specified joint disorders, right wrist: Secondary | ICD-10-CM | POA: Diagnosis not present

## 2021-10-04 DIAGNOSIS — Z7984 Long term (current) use of oral hypoglycemic drugs: Secondary | ICD-10-CM | POA: Diagnosis not present

## 2021-10-04 DIAGNOSIS — M25831 Other specified joint disorders, right wrist: Secondary | ICD-10-CM | POA: Diagnosis not present

## 2021-10-04 DIAGNOSIS — G8918 Other acute postprocedural pain: Secondary | ICD-10-CM | POA: Diagnosis not present

## 2021-10-04 DIAGNOSIS — M21731 Unequal limb length (acquired), right ulna: Secondary | ICD-10-CM | POA: Diagnosis not present

## 2021-10-04 DIAGNOSIS — Z79899 Other long term (current) drug therapy: Secondary | ICD-10-CM | POA: Diagnosis not present

## 2021-10-04 DIAGNOSIS — X58XXXD Exposure to other specified factors, subsequent encounter: Secondary | ICD-10-CM | POA: Diagnosis not present

## 2021-10-04 DIAGNOSIS — S52501P Unspecified fracture of the lower end of right radius, subsequent encounter for closed fracture with malunion: Secondary | ICD-10-CM | POA: Diagnosis not present

## 2021-10-17 DIAGNOSIS — R531 Weakness: Secondary | ICD-10-CM | POA: Diagnosis not present

## 2021-10-17 DIAGNOSIS — M25631 Stiffness of right wrist, not elsewhere classified: Secondary | ICD-10-CM | POA: Diagnosis not present

## 2021-10-17 DIAGNOSIS — M25431 Effusion, right wrist: Secondary | ICD-10-CM | POA: Diagnosis not present

## 2021-10-17 DIAGNOSIS — M25531 Pain in right wrist: Secondary | ICD-10-CM | POA: Diagnosis not present

## 2021-10-20 DIAGNOSIS — R531 Weakness: Secondary | ICD-10-CM | POA: Diagnosis not present

## 2021-10-20 DIAGNOSIS — M25531 Pain in right wrist: Secondary | ICD-10-CM | POA: Diagnosis not present

## 2021-10-20 DIAGNOSIS — M25431 Effusion, right wrist: Secondary | ICD-10-CM | POA: Diagnosis not present

## 2021-10-20 DIAGNOSIS — M25631 Stiffness of right wrist, not elsewhere classified: Secondary | ICD-10-CM | POA: Diagnosis not present

## 2021-10-24 DIAGNOSIS — R531 Weakness: Secondary | ICD-10-CM | POA: Diagnosis not present

## 2021-10-24 DIAGNOSIS — M25431 Effusion, right wrist: Secondary | ICD-10-CM | POA: Diagnosis not present

## 2021-10-24 DIAGNOSIS — M25631 Stiffness of right wrist, not elsewhere classified: Secondary | ICD-10-CM | POA: Diagnosis not present

## 2021-10-24 DIAGNOSIS — M25531 Pain in right wrist: Secondary | ICD-10-CM | POA: Diagnosis not present

## 2021-10-26 DIAGNOSIS — M25631 Stiffness of right wrist, not elsewhere classified: Secondary | ICD-10-CM | POA: Diagnosis not present

## 2021-10-26 DIAGNOSIS — M25531 Pain in right wrist: Secondary | ICD-10-CM | POA: Diagnosis not present

## 2021-10-26 DIAGNOSIS — M25431 Effusion, right wrist: Secondary | ICD-10-CM | POA: Diagnosis not present

## 2021-10-26 DIAGNOSIS — R531 Weakness: Secondary | ICD-10-CM | POA: Diagnosis not present

## 2021-10-31 DIAGNOSIS — M25631 Stiffness of right wrist, not elsewhere classified: Secondary | ICD-10-CM | POA: Diagnosis not present

## 2021-10-31 DIAGNOSIS — R531 Weakness: Secondary | ICD-10-CM | POA: Diagnosis not present

## 2021-10-31 DIAGNOSIS — M25531 Pain in right wrist: Secondary | ICD-10-CM | POA: Diagnosis not present

## 2021-10-31 DIAGNOSIS — M25431 Effusion, right wrist: Secondary | ICD-10-CM | POA: Diagnosis not present

## 2021-11-02 DIAGNOSIS — M25531 Pain in right wrist: Secondary | ICD-10-CM | POA: Diagnosis not present

## 2021-11-02 DIAGNOSIS — M25631 Stiffness of right wrist, not elsewhere classified: Secondary | ICD-10-CM | POA: Diagnosis not present

## 2021-11-02 DIAGNOSIS — M25431 Effusion, right wrist: Secondary | ICD-10-CM | POA: Diagnosis not present

## 2021-11-02 DIAGNOSIS — R531 Weakness: Secondary | ICD-10-CM | POA: Diagnosis not present

## 2021-11-08 DIAGNOSIS — R531 Weakness: Secondary | ICD-10-CM | POA: Diagnosis not present

## 2021-11-08 DIAGNOSIS — M25431 Effusion, right wrist: Secondary | ICD-10-CM | POA: Diagnosis not present

## 2021-11-08 DIAGNOSIS — M25531 Pain in right wrist: Secondary | ICD-10-CM | POA: Diagnosis not present

## 2021-11-08 DIAGNOSIS — M25631 Stiffness of right wrist, not elsewhere classified: Secondary | ICD-10-CM | POA: Diagnosis not present

## 2021-11-14 DIAGNOSIS — M25831 Other specified joint disorders, right wrist: Secondary | ICD-10-CM | POA: Diagnosis not present

## 2021-12-04 DIAGNOSIS — I10 Essential (primary) hypertension: Secondary | ICD-10-CM | POA: Diagnosis not present

## 2021-12-04 DIAGNOSIS — E785 Hyperlipidemia, unspecified: Secondary | ICD-10-CM | POA: Diagnosis not present

## 2021-12-04 DIAGNOSIS — R0602 Shortness of breath: Secondary | ICD-10-CM | POA: Diagnosis not present

## 2021-12-04 DIAGNOSIS — I2510 Atherosclerotic heart disease of native coronary artery without angina pectoris: Secondary | ICD-10-CM | POA: Diagnosis not present

## 2021-12-07 DIAGNOSIS — R0602 Shortness of breath: Secondary | ICD-10-CM | POA: Diagnosis not present

## 2021-12-07 DIAGNOSIS — E785 Hyperlipidemia, unspecified: Secondary | ICD-10-CM | POA: Diagnosis not present

## 2021-12-07 DIAGNOSIS — I10 Essential (primary) hypertension: Secondary | ICD-10-CM | POA: Diagnosis not present

## 2021-12-07 DIAGNOSIS — I2510 Atherosclerotic heart disease of native coronary artery without angina pectoris: Secondary | ICD-10-CM | POA: Diagnosis not present

## 2021-12-12 DIAGNOSIS — R0602 Shortness of breath: Secondary | ICD-10-CM | POA: Diagnosis not present

## 2021-12-12 DIAGNOSIS — I2510 Atherosclerotic heart disease of native coronary artery without angina pectoris: Secondary | ICD-10-CM | POA: Diagnosis not present

## 2021-12-19 DIAGNOSIS — I2510 Atherosclerotic heart disease of native coronary artery without angina pectoris: Secondary | ICD-10-CM | POA: Diagnosis not present

## 2021-12-19 DIAGNOSIS — R0602 Shortness of breath: Secondary | ICD-10-CM | POA: Diagnosis not present

## 2021-12-19 DIAGNOSIS — I10 Essential (primary) hypertension: Secondary | ICD-10-CM | POA: Diagnosis not present

## 2021-12-19 DIAGNOSIS — E669 Obesity, unspecified: Secondary | ICD-10-CM | POA: Diagnosis not present

## 2022-02-26 DIAGNOSIS — H02831 Dermatochalasis of right upper eyelid: Secondary | ICD-10-CM | POA: Diagnosis not present

## 2022-02-26 DIAGNOSIS — L723 Sebaceous cyst: Secondary | ICD-10-CM | POA: Diagnosis not present

## 2022-02-26 DIAGNOSIS — H02403 Unspecified ptosis of bilateral eyelids: Secondary | ICD-10-CM | POA: Diagnosis not present

## 2022-02-26 DIAGNOSIS — H02834 Dermatochalasis of left upper eyelid: Secondary | ICD-10-CM | POA: Diagnosis not present

## 2022-03-14 DIAGNOSIS — I10 Essential (primary) hypertension: Secondary | ICD-10-CM | POA: Diagnosis not present

## 2022-03-14 DIAGNOSIS — I252 Old myocardial infarction: Secondary | ICD-10-CM | POA: Diagnosis not present

## 2022-03-14 DIAGNOSIS — I2510 Atherosclerotic heart disease of native coronary artery without angina pectoris: Secondary | ICD-10-CM | POA: Diagnosis not present

## 2022-03-14 DIAGNOSIS — Z1159 Encounter for screening for other viral diseases: Secondary | ICD-10-CM | POA: Diagnosis not present

## 2022-03-14 DIAGNOSIS — E119 Type 2 diabetes mellitus without complications: Secondary | ICD-10-CM | POA: Diagnosis not present

## 2022-03-14 DIAGNOSIS — E785 Hyperlipidemia, unspecified: Secondary | ICD-10-CM | POA: Diagnosis not present

## 2022-03-14 DIAGNOSIS — Z125 Encounter for screening for malignant neoplasm of prostate: Secondary | ICD-10-CM | POA: Diagnosis not present

## 2022-03-14 DIAGNOSIS — Z0000 Encounter for general adult medical examination without abnormal findings: Secondary | ICD-10-CM | POA: Diagnosis not present

## 2022-03-23 DIAGNOSIS — L723 Sebaceous cyst: Secondary | ICD-10-CM | POA: Diagnosis not present

## 2022-11-09 DIAGNOSIS — I25118 Atherosclerotic heart disease of native coronary artery with other forms of angina pectoris: Secondary | ICD-10-CM | POA: Diagnosis not present

## 2022-11-09 DIAGNOSIS — E782 Mixed hyperlipidemia: Secondary | ICD-10-CM | POA: Diagnosis not present

## 2022-11-09 DIAGNOSIS — I10 Essential (primary) hypertension: Secondary | ICD-10-CM | POA: Diagnosis not present

## 2022-11-09 DIAGNOSIS — E119 Type 2 diabetes mellitus without complications: Secondary | ICD-10-CM | POA: Diagnosis not present

## 2022-11-15 DIAGNOSIS — I2510 Atherosclerotic heart disease of native coronary artery without angina pectoris: Secondary | ICD-10-CM | POA: Diagnosis not present

## 2022-11-15 DIAGNOSIS — E119 Type 2 diabetes mellitus without complications: Secondary | ICD-10-CM | POA: Diagnosis not present

## 2022-11-15 DIAGNOSIS — I10 Essential (primary) hypertension: Secondary | ICD-10-CM | POA: Diagnosis not present

## 2022-11-15 DIAGNOSIS — E785 Hyperlipidemia, unspecified: Secondary | ICD-10-CM | POA: Diagnosis not present

## 2023-02-06 ENCOUNTER — Ambulatory Visit: Payer: BC Managed Care – PPO | Admitting: Internal Medicine

## 2023-02-06 ENCOUNTER — Encounter: Payer: Self-pay | Admitting: Internal Medicine

## 2023-02-06 VITALS — BP 138/82 | HR 90 | Temp 98.0°F | Resp 18 | Ht 68.0 in | Wt 199.4 lb

## 2023-02-06 DIAGNOSIS — R059 Cough, unspecified: Secondary | ICD-10-CM | POA: Diagnosis not present

## 2023-02-06 DIAGNOSIS — J40 Bronchitis, not specified as acute or chronic: Secondary | ICD-10-CM | POA: Diagnosis not present

## 2023-02-06 LAB — POC COVID19 BINAXNOW: SARS Coronavirus 2 Ag: NEGATIVE

## 2023-02-06 LAB — POC INFLUENZA A&B (BINAX/QUICKVUE)
Influenza A, POC: NEGATIVE
Influenza B, POC: NEGATIVE

## 2023-02-06 MED ORDER — LEVOFLOXACIN 750 MG PO TABS: 750.0000 mg | ORAL_TABLET | Freq: Every day | ORAL | 0 refills | Status: AC

## 2023-02-06 MED ORDER — HYDROCOD POLI-CHLORPHE POLI ER 10-8 MG/5ML PO SUER: 5.0000 mL | Freq: Two times a day (BID) | ORAL | 0 refills | Status: AC | PRN

## 2023-02-06 NOTE — Addendum Note (Signed)
 Addended by: Pecolia Bourbon on: 02/06/2023 11:44 AM   Modules accepted: Orders

## 2023-02-06 NOTE — Progress Notes (Signed)
 Office Visit  Subjective   Patient ID: [name] [name]   DOB: 02-03-1970   Age: 53 y.o.   MRN: [id number]   Chief Complaint Chief Complaint  Patient presents with   New Patient (Initial Visit)    New Patient      History of Present Illness The patient is a 53 yo male who comes in today with complaints of respiratory illness.  He states that about 1 week ago he began having fevers, chills, with chest congestion with dry cough as well as myalgias and headache.  He took mucinex and basically stayed in bed for 3 days at that time.  The patient then subsequently had development of cough productive of green sputum and some SOB.  He states that in last few days he has had some scant blood when he coughs.  His cough is keeping him up at night.  There is no sinus congestion, post nasal drip, nausea, vomiting or diarrhea.  He denies any history of tobacco abuse, COPD, or asthma.       Past Medical History Past Medical History:  Diagnosis Date   Chest pain 02/2014   Diabetes mellitus without complication (HCC)    type 2   Diverticulitis    Headache    Hypertension      Allergies No Known Allergies   Medications  Current Outpatient Medications:    amLODipine (NORVASC) 5 MG tablet, Take 5 mg by mouth daily., Disp: , Rfl:    aspirin  EC 81 MG EC tablet, Take 1 tablet (81 mg total) by mouth daily. (Patient not taking: Reported on 02/23/2014), Disp: 30 tablet, Rfl: 3   atorvastatin  (LIPITOR) 40 MG tablet, Take 1 tablet (40 mg total) by mouth daily at 6 PM., Disp: 30 tablet, Rfl: 3   clotrimazole-betamethasone (LOTRISONE) cream, Apply 1 application topically 2 (two) times daily., Disp: , Rfl: 0   gemfibrozil  (LOPID ) 600 MG tablet, Take 600 mg by mouth 2 (two) times daily before a meal. , Disp: , Rfl:    metFORMIN (GLUCOPHAGE) 500 MG tablet, Take 500 mg by mouth 2 (two) times daily with a meal., Disp: , Rfl: 3   metoprolol  succinate (TOPROL  XL) 50 MG 24 hr tablet, Take 1 tablet (50 mg total) by  mouth daily. Take with or immediately following a meal., Disp: 30 tablet, Rfl: 3   Multiple Vitamin (MULTIVITAMINS PO), Take 1 tablet by mouth daily., Disp: , Rfl:    nitroGLYCERIN  (NITROSTAT ) 0.4 MG SL tablet, Place 1 tablet (0.4 mg total) under the tongue every 5 (five) minutes x 3 doses as needed for chest pain., Disp: 25 tablet, Rfl: 12   omeprazole  (PRILOSEC) 20 MG capsule, Take 1 capsule (20 mg total) by mouth daily., Disp: 30 capsule, Rfl: 0   ondansetron  (ZOFRAN  ODT) 8 MG disintegrating tablet, Take 1 tablet (8 mg total) by mouth every 8 (eight) hours as needed for nausea., Disp: 20 tablet, Rfl: 0   ramipril  (ALTACE ) 10 MG capsule, Take 1 capsule (10 mg total) by mouth daily., Disp: 30 capsule, Rfl: 3   ticagrelor  (BRILINTA ) 90 MG TABS tablet, Take 1 tablet (90 mg total) by mouth 2 (two) times daily. (Patient not taking: Reported on 09/18/2016), Disp: 60 tablet, Rfl: 6   Review of Systems Review of Systems  Constitutional:  Negative for chills and fever.  HENT:  Negative for congestion, sinus pain and sore throat.   Respiratory:  Positive for cough, sputum production and shortness of breath. Negative for wheezing.  Cardiovascular:  Negative for chest pain and leg swelling.  Gastrointestinal:  Negative for abdominal pain, constipation, diarrhea, nausea and vomiting.  Skin:  Negative for itching and rash.  Neurological:  Positive for weakness. Negative for dizziness.       Objective:    Vitals BP 138/82 (BP Location: Left Arm, Patient Position: Sitting, Cuff Size: Normal)   Pulse 90   Temp 98 F (36.7 C)   Resp 18   Ht 5' 8 (1.727 m)   Wt 199 lb 6 oz (90.4 kg)   SpO2 98%   BMI 30.31 kg/m    Physical Examination Physical Exam Constitutional:      Appearance: Normal appearance. He is not ill-appearing.  HENT:     Right Ear: Tympanic membrane, ear canal and external ear normal.     Left Ear: Tympanic membrane, ear canal and external ear normal.     Nose: Nose normal. No  congestion or rhinorrhea.     Mouth/Throat:     Mouth: Mucous membranes are moist.     Pharynx: Oropharynx is clear. No oropharyngeal exudate or posterior oropharyngeal erythema.  Cardiovascular:     Rate and Rhythm: Normal rate and regular rhythm.     Pulses: Normal pulses.     Heart sounds: No murmur heard.    No friction rub. No gallop.  Pulmonary:     Effort: Pulmonary effort is normal. No respiratory distress.     Breath sounds: No wheezing, rhonchi or rales.  Abdominal:     General: Abdomen is flat. Bowel sounds are normal. There is no distension.     Palpations: Abdomen is soft.     Tenderness: There is no abdominal tenderness.  Musculoskeletal:     Right lower leg: No edema.     Left lower leg: No edema.  Skin:    General: Skin is warm and dry.     Findings: No rash.  Neurological:     Mental Status: He is alert.        Assessment & Plan:   Bronchitis He has bronchitis vs walking pneumonia.  I did a rapid COVID-19 test and flu test and these were negative.  I am going to place him on levaquin  and continue on muxinex at this time.  I will also give him some tussionex for his cough.  We will obtain a CXR.  Continue supportive care.    No follow-ups on file.   Selinda Fleeta Finger, MD

## 2023-02-06 NOTE — Assessment & Plan Note (Signed)
 He has bronchitis vs walking pneumonia.  I did a rapid COVID-19 test and flu test and these were negative.  I am going to place him on levaquin  and continue on muxinex at this time.  I will also give him some tussionex for his cough.  We will obtain a CXR.  Continue supportive care.

## 2023-02-11 ENCOUNTER — Encounter: Payer: Self-pay | Admitting: Internal Medicine

## 2023-03-07 DIAGNOSIS — I10 Essential (primary) hypertension: Secondary | ICD-10-CM | POA: Diagnosis not present

## 2023-03-07 DIAGNOSIS — E119 Type 2 diabetes mellitus without complications: Secondary | ICD-10-CM | POA: Diagnosis not present

## 2023-03-07 DIAGNOSIS — I25118 Atherosclerotic heart disease of native coronary artery with other forms of angina pectoris: Secondary | ICD-10-CM | POA: Diagnosis not present

## 2023-03-07 DIAGNOSIS — E782 Mixed hyperlipidemia: Secondary | ICD-10-CM | POA: Diagnosis not present

## 2023-07-24 DIAGNOSIS — E782 Mixed hyperlipidemia: Secondary | ICD-10-CM | POA: Diagnosis not present

## 2023-07-24 DIAGNOSIS — I10 Essential (primary) hypertension: Secondary | ICD-10-CM | POA: Diagnosis not present

## 2023-07-24 DIAGNOSIS — I25118 Atherosclerotic heart disease of native coronary artery with other forms of angina pectoris: Secondary | ICD-10-CM | POA: Diagnosis not present

## 2023-07-24 DIAGNOSIS — E119 Type 2 diabetes mellitus without complications: Secondary | ICD-10-CM | POA: Diagnosis not present

## 2023-10-24 DIAGNOSIS — I2510 Atherosclerotic heart disease of native coronary artery without angina pectoris: Secondary | ICD-10-CM | POA: Diagnosis not present

## 2023-10-24 DIAGNOSIS — E785 Hyperlipidemia, unspecified: Secondary | ICD-10-CM | POA: Diagnosis not present

## 2023-10-24 DIAGNOSIS — E119 Type 2 diabetes mellitus without complications: Secondary | ICD-10-CM | POA: Diagnosis not present

## 2023-11-01 DIAGNOSIS — I10 Essential (primary) hypertension: Secondary | ICD-10-CM | POA: Diagnosis not present

## 2023-11-01 DIAGNOSIS — I25118 Atherosclerotic heart disease of native coronary artery with other forms of angina pectoris: Secondary | ICD-10-CM | POA: Diagnosis not present

## 2023-11-01 DIAGNOSIS — E119 Type 2 diabetes mellitus without complications: Secondary | ICD-10-CM | POA: Diagnosis not present

## 2023-11-01 DIAGNOSIS — E782 Mixed hyperlipidemia: Secondary | ICD-10-CM | POA: Diagnosis not present
